# Patient Record
Sex: Male | Born: 1960 | Race: Black or African American | Hispanic: No | Marital: Single | Smoking: Current some day smoker
Health system: Southern US, Community
[De-identification: ages and names within clinical notes are randomized; demographics above are authoritative.]

## PROBLEM LIST (undated history)

## (undated) DIAGNOSIS — N400 Enlarged prostate without lower urinary tract symptoms: Secondary | ICD-10-CM

## (undated) DIAGNOSIS — I10 Essential (primary) hypertension: Secondary | ICD-10-CM

## (undated) HISTORY — PX: OTHER SURGICAL HISTORY: SHX169

---

## 2001-05-21 ENCOUNTER — Emergency Department (HOSPITAL_COMMUNITY): Admission: EM | Admit: 2001-05-21 | Discharge: 2001-05-21 | Payer: Self-pay | Admitting: Emergency Medicine

## 2001-05-25 ENCOUNTER — Emergency Department (HOSPITAL_COMMUNITY): Admission: EM | Admit: 2001-05-25 | Discharge: 2001-05-25 | Payer: Self-pay | Admitting: Emergency Medicine

## 2013-08-08 ENCOUNTER — Encounter (HOSPITAL_COMMUNITY): Payer: Self-pay | Admitting: *Deleted

## 2013-08-08 ENCOUNTER — Emergency Department (HOSPITAL_COMMUNITY)
Admission: EM | Admit: 2013-08-08 | Discharge: 2013-08-08 | Disposition: A | Discharge status: Home / Self Care | Payer: Self-pay | Attending: Emergency Medicine | Admitting: Emergency Medicine

## 2013-08-08 DIAGNOSIS — K047 Periapical abscess without sinus: Secondary | ICD-10-CM

## 2013-08-08 DIAGNOSIS — K0889 Other specified disorders of teeth and supporting structures: Secondary | ICD-10-CM

## 2013-08-08 DIAGNOSIS — F17200 Nicotine dependence, unspecified, uncomplicated: Secondary | ICD-10-CM | POA: Insufficient documentation

## 2013-08-08 DIAGNOSIS — K044 Acute apical periodontitis of pulpal origin: Secondary | ICD-10-CM | POA: Insufficient documentation

## 2013-08-08 MED ORDER — AMOXICILLIN 500 MG PO CAPS: 500.0000 mg | ORAL_CAPSULE | Freq: Three times a day (TID) | ORAL | Status: DC

## 2013-08-08 MED ORDER — HYDROCODONE-ACETAMINOPHEN 5-325 MG PO TABS: 1.0000 | ORAL_TABLET | Freq: Four times a day (QID) | ORAL | Status: DC | PRN

## 2013-08-08 MED ORDER — OXYCODONE-ACETAMINOPHEN 5-325 MG PO TABS
2.0000 | ORAL_TABLET | Freq: Once | ORAL | Status: AC
  Administered 2013-08-08: 2 via ORAL
  Filled 2013-08-08: qty 2

## 2013-08-08 NOTE — ED Provider Notes (Signed)
CSN: [id number]     Arrival date & time 08/08/13  4098 History   First MD Initiated Contact with Patient 08/08/13 0535     Chief Complaint  Patient presents with  . Dental Pain   (Consider location/radiation/quality/duration/timing/severity/associated sxs/prior Treatment) HPI Comments: 52 year old male presents to the emergency department complaining of left lower tooth pain x2 weeks. Describes pain as sharp and throbbing, worse with chewing rated 10 out of 10. Pain radiates to the left side of his head. States the left-sided space has been swelling on and off. He has tried taking Tylenol without relief. He does not have a dentist. Denies fever, chills, difficulty breathing or swallowing.  Patient is a 52 y.o. male presenting with tooth pain. The history is provided by the patient and the spouse.  Dental Pain Associated symptoms: no fever     History reviewed. No pertinent past medical history. History reviewed. No pertinent past surgical history. No family history on file. History  Substance Use Topics  . Smoking status: Current Every Day Smoker  . Smokeless tobacco: Not on file  . Alcohol Use: Yes    Review of Systems  Constitutional: Negative for fever and chills.  HENT: Positive for dental problem. Negative for trouble swallowing.   All other systems reviewed and are negative.    Allergies  Review of patient's allergies indicates no known allergies.  Home Medications   Current Outpatient Rx  Name  Route  Sig  Dispense  Refill  . acetaminophen (TYLENOL) 500 MG tablet   Oral   Take 1,000 mg by mouth every 4 (four) hours as needed for pain.         Marland Kitchen amoxicillin (AMOXIL) 500 MG capsule   Oral   Take 1 capsule (500 mg total) by mouth 3 (three) times daily.   21 capsule   0   . HYDROcodone-acetaminophen (NORCO/VICODIN) 5-325 MG per tablet   Oral   Take 1-2 tablets by mouth every 6 (six) hours as needed for pain.   6 tablet   0    BP 140/88  Temp(Src) 99.3 F  (37.4 C) (Oral)  Resp 18  Ht 5\' 11"  (1.803 m)  Wt 200 lb (90.719 kg)  BMI 27.91 kg/m2  SpO2 99% Physical Exam  Nursing note and vitals reviewed. Constitutional: He is oriented to person, place, and time. He appears well-developed and well-nourished. No distress.  HENT:  Head: Normocephalic and atraumatic.  Mouth/Throat: Uvula is midline, oropharynx is clear and moist and mucous membranes are normal. No trismus in the jaw.  Poor dentition throughout. Left lower third molar tender, surrounding erythema and edema without abscess. No facial swelling.  Eyes: Conjunctivae are normal.  Neck: Normal range of motion. Neck supple.  Cardiovascular: Normal rate, regular rhythm and normal heart sounds.   Pulmonary/Chest: Effort normal and breath sounds normal.  Musculoskeletal: Normal range of motion. He exhibits no edema.  Lymphadenopathy:       Head (left side): Submandibular adenopathy present.  Neurological: He is alert and oriented to person, place, and time.  Skin: Skin is warm and dry. He is not diaphoretic.  Psychiatric: He has a normal mood and affect. His behavior is normal.    ED Course  Procedures (including critical care time) Labs Review Labs Reviewed - No data to display Imaging Review No results found.  MDM   1. Dental infection   2. Pain, dental     Dental pain associated with dental infection. No evidence of dental abscess. Patient is  afebrile, non toxic appearing and swallowing secretions well. I gave patient referral to dentist and stressed the importance of dental follow up for ultimate management of dental pain. I will also give amoxicillin and pain control. Patient voices understanding and is agreeable to plan.     Trevor Mace, PA-C 08/08/13 [phone]

## 2013-08-08 NOTE — ED Notes (Signed)
Patient states left sided tooth pain x 2 weeks now radiating and causing headache

## 2013-08-09 NOTE — ED Provider Notes (Signed)
Medical screening examination/treatment/procedure(s) were performed by non-physician practitioner and as supervising physician I was immediately available for consultation/collaboration.   Gwyneth Sprout, MD 08/09/13 [phone]

## 2019-12-09 ENCOUNTER — Emergency Department (HOSPITAL_COMMUNITY): Payer: No Typology Code available for payment source

## 2019-12-09 ENCOUNTER — Encounter (HOSPITAL_COMMUNITY): Payer: Self-pay | Admitting: Emergency Medicine

## 2019-12-09 ENCOUNTER — Emergency Department (HOSPITAL_COMMUNITY)
Admission: EM | Admit: 2019-12-09 | Discharge: 2019-12-09 | Disposition: A | Discharge status: Home / Self Care | Payer: No Typology Code available for payment source | Attending: Emergency Medicine | Admitting: Emergency Medicine

## 2019-12-09 DIAGNOSIS — Z20822 Contact with and (suspected) exposure to covid-19: Secondary | ICD-10-CM | POA: Insufficient documentation

## 2019-12-09 DIAGNOSIS — I10 Essential (primary) hypertension: Secondary | ICD-10-CM | POA: Insufficient documentation

## 2019-12-09 DIAGNOSIS — F17200 Nicotine dependence, unspecified, uncomplicated: Secondary | ICD-10-CM | POA: Diagnosis not present

## 2019-12-09 DIAGNOSIS — Y92410 Unspecified street and highway as the place of occurrence of the external cause: Secondary | ICD-10-CM | POA: Diagnosis not present

## 2019-12-09 DIAGNOSIS — R0789 Other chest pain: Secondary | ICD-10-CM | POA: Diagnosis not present

## 2019-12-09 DIAGNOSIS — Z23 Encounter for immunization: Secondary | ICD-10-CM | POA: Diagnosis not present

## 2019-12-09 DIAGNOSIS — Y999 Unspecified external cause status: Secondary | ICD-10-CM | POA: Insufficient documentation

## 2019-12-09 DIAGNOSIS — Y939 Activity, unspecified: Secondary | ICD-10-CM | POA: Insufficient documentation

## 2019-12-09 DIAGNOSIS — S52331A Displaced oblique fracture of shaft of right radius, initial encounter for closed fracture: Secondary | ICD-10-CM

## 2019-12-09 DIAGNOSIS — S59911A Unspecified injury of right forearm, initial encounter: Secondary | ICD-10-CM | POA: Diagnosis present

## 2019-12-09 HISTORY — DX: Essential (primary) hypertension: I10

## 2019-12-09 LAB — RESPIRATORY PANEL BY RT PCR (FLU A&B, COVID)
Influenza A by PCR: NEGATIVE
Influenza B by PCR: NEGATIVE
SARS Coronavirus 2 by RT PCR: NEGATIVE

## 2019-12-09 MED ORDER — TETANUS-DIPHTH-ACELL PERTUSSIS 5-2.5-18.5 LF-MCG/0.5 IM SUSP
0.5000 mL | Freq: Once | INTRAMUSCULAR | Status: AC
  Administered 2019-12-09: 18:00:00 0.5 mL via INTRAMUSCULAR
  Filled 2019-12-09: qty 0.5

## 2019-12-09 MED ORDER — HYDROMORPHONE HCL 1 MG/ML IJ SOLN
1.0000 mg | Freq: Once | INTRAMUSCULAR | Status: AC
  Administered 2019-12-09: 19:00:00 1 mg via INTRAVENOUS
  Filled 2019-12-09: qty 1

## 2019-12-09 MED ORDER — CYCLOBENZAPRINE HCL 10 MG PO TABS
10.0000 mg | ORAL_TABLET | Freq: Once | ORAL | Status: AC
  Administered 2019-12-09: 19:00:00 10 mg via ORAL
  Filled 2019-12-09: qty 1

## 2019-12-09 MED ORDER — HYDROCODONE-ACETAMINOPHEN 5-325 MG PO TABS
1.0000 | ORAL_TABLET | Freq: Four times a day (QID) | ORAL | 0 refills | Status: DC | PRN
Start: 2019-12-09 — End: 2019-12-20
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
## 2019-12-09 NOTE — ED Notes (Signed)
Pt to xray
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
## 2019-12-09 NOTE — ED Notes (Signed)
Ortho tech paged, requested by PA.
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
## 2019-12-09 NOTE — Progress Notes (Signed)
Orthopedic Tech Progress Note Patient Details:  Trentan Trippe 11-24-1960 [id number]  Ortho Devices Type of Ortho Device: Ace wrap, Sugartong splint Ortho Device/Splint Location: RUE Ortho Device/Splint Interventions: Ordered, Application   Post Interventions Patient Tolerated: Well Instructions Provided: Care of device   Ancil Linsey 12/09/2019, 8:13 PM
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
## 2019-12-10 ENCOUNTER — Other Ambulatory Visit: Payer: Self-pay | Admitting: Orthopedic Surgery

## 2019-12-13 ENCOUNTER — Encounter (HOSPITAL_COMMUNITY)
Admission: RE | Admit: 2019-12-13 | Discharge: 2019-12-13 | Disposition: A | Discharge status: Home / Self Care | Payer: No Typology Code available for payment source | Source: Ambulatory Visit | Attending: Orthopedic Surgery | Admitting: Orthopedic Surgery

## 2019-12-13 ENCOUNTER — Inpatient Hospital Stay (HOSPITAL_COMMUNITY): Admission: RE | Admit: 2019-12-13 | Payer: Self-pay | Source: Ambulatory Visit

## 2019-12-13 ENCOUNTER — Other Ambulatory Visit: Payer: Self-pay

## 2019-12-13 ENCOUNTER — Encounter (HOSPITAL_BASED_OUTPATIENT_CLINIC_OR_DEPARTMENT_OTHER)
Admission: RE | Admit: 2019-12-13 | Discharge: 2019-12-13 | Disposition: A | Discharge status: Home / Self Care | Payer: No Typology Code available for payment source | Source: Ambulatory Visit | Attending: Orthopedic Surgery | Admitting: Orthopedic Surgery

## 2019-12-13 ENCOUNTER — Encounter (HOSPITAL_BASED_OUTPATIENT_CLINIC_OR_DEPARTMENT_OTHER): Payer: Self-pay | Admitting: Orthopedic Surgery

## 2019-12-13 DIAGNOSIS — Z01812 Encounter for preprocedural laboratory examination: Secondary | ICD-10-CM | POA: Diagnosis present

## 2019-12-13 LAB — SURGICAL PCR SCREEN
MRSA, PCR: NEGATIVE
Staphylococcus aureus: NEGATIVE

## 2019-12-13 MED ORDER — ENSURE PRE-SURGERY PO LIQD: 296.0000 mL | Freq: Once | ORAL | Status: DC

## 2019-12-13 NOTE — Progress Notes (Signed)

## 2019-12-14 MED ORDER — BUPIVACAINE HCL (PF) 0.25 % IJ SOLN
INTRAMUSCULAR | Status: AC
  Filled 2019-12-14: qty 30

## 2019-12-14 MED ORDER — BACITRACIN-NEOMYCIN-POLYMYXIN OINTMENT TUBE
TOPICAL_OINTMENT | CUTANEOUS | Status: AC
  Filled 2019-12-14: qty 14.17

## 2019-12-17 ENCOUNTER — Encounter (HOSPITAL_BASED_OUTPATIENT_CLINIC_OR_DEPARTMENT_OTHER)
Admission: RE | Admit: 2019-12-17 | Discharge: 2019-12-17 | Disposition: A | Discharge status: Home / Self Care | Payer: No Typology Code available for payment source | Source: Ambulatory Visit | Attending: Orthopedic Surgery | Admitting: Orthopedic Surgery

## 2019-12-17 ENCOUNTER — Other Ambulatory Visit: Payer: Self-pay

## 2019-12-17 ENCOUNTER — Other Ambulatory Visit (HOSPITAL_COMMUNITY)
Admission: RE | Admit: 2019-12-17 | Discharge: 2019-12-17 | Disposition: A | Discharge status: Home / Self Care | Payer: No Typology Code available for payment source | Source: Ambulatory Visit | Attending: Orthopedic Surgery | Admitting: Orthopedic Surgery

## 2019-12-17 DIAGNOSIS — Z20822 Contact with and (suspected) exposure to covid-19: Secondary | ICD-10-CM | POA: Diagnosis not present

## 2019-12-17 DIAGNOSIS — Z01812 Encounter for preprocedural laboratory examination: Secondary | ICD-10-CM | POA: Diagnosis present

## 2019-12-17 LAB — BASIC METABOLIC PANEL
Anion gap: 13 (ref 5–15)
BUN: 14 mg/dL (ref 6–20)
CO2: 26 mmol/L (ref 22–32)
Calcium: 9.5 mg/dL (ref 8.9–10.3)
Chloride: 99 mmol/L (ref 98–111)
Creatinine, Ser: 0.74 mg/dL (ref 0.61–1.24)
GFR calc Af Amer: >60 mL/min (ref >60)
GFR calc non Af Amer: >60 mL/min (ref >60)
Glucose, Bld: 100 mg/dL — ABNORMAL HIGH (ref 70–99)
Potassium: 4.6 mmol/L (ref 3.5–5.1)
Sodium: 138 mmol/L (ref 135–145)

## 2019-12-17 LAB — SARS CORONAVIRUS 2 (TAT 6-24 HRS): SARS Coronavirus 2: NEGATIVE

## 2019-12-20 ENCOUNTER — Encounter (HOSPITAL_BASED_OUTPATIENT_CLINIC_OR_DEPARTMENT_OTHER)
Admission: RE | Disposition: A | Discharge status: Home / Self Care | Payer: Self-pay | Source: Home / Self Care | Attending: Orthopedic Surgery

## 2019-12-20 ENCOUNTER — Ambulatory Visit (HOSPITAL_BASED_OUTPATIENT_CLINIC_OR_DEPARTMENT_OTHER): Payer: No Typology Code available for payment source | Admitting: Anesthesiology

## 2019-12-20 ENCOUNTER — Other Ambulatory Visit: Payer: Self-pay

## 2019-12-20 ENCOUNTER — Ambulatory Visit (HOSPITAL_COMMUNITY)
Admission: RE | Admit: 2019-12-20 | Discharge: 2019-12-20 | Disposition: A | Discharge status: Home / Self Care | Payer: No Typology Code available for payment source | Attending: Orthopedic Surgery | Admitting: Orthopedic Surgery

## 2019-12-20 ENCOUNTER — Ambulatory Visit (HOSPITAL_COMMUNITY): Payer: No Typology Code available for payment source

## 2019-12-20 ENCOUNTER — Encounter (HOSPITAL_BASED_OUTPATIENT_CLINIC_OR_DEPARTMENT_OTHER): Payer: Self-pay | Admitting: Orthopedic Surgery

## 2019-12-20 DIAGNOSIS — S52371A Galeazzi's fracture of right radius, initial encounter for closed fracture: Secondary | ICD-10-CM | POA: Insufficient documentation

## 2019-12-20 DIAGNOSIS — I10 Essential (primary) hypertension: Secondary | ICD-10-CM | POA: Diagnosis not present

## 2019-12-20 DIAGNOSIS — F17200 Nicotine dependence, unspecified, uncomplicated: Secondary | ICD-10-CM | POA: Diagnosis not present

## 2019-12-20 DIAGNOSIS — Z4789 Encounter for other orthopedic aftercare: Secondary | ICD-10-CM

## 2019-12-20 HISTORY — PX: OPEN REDUCTION INTERNAL FIXATION (ORIF) DISTAL RADIAL FRACTURE: SHX5989

## 2019-12-20 SURGERY — OPEN REDUCTION INTERNAL FIXATION (ORIF) DISTAL RADIUS FRACTURE
Anesthesia: General | Site: Arm Lower | Laterality: Right

## 2019-12-20 MED ORDER — CEFAZOLIN SODIUM-DEXTROSE 2-4 GM/100ML-% IV SOLN
INTRAVENOUS | Status: AC
  Filled 2019-12-20: qty 100

## 2019-12-20 MED ORDER — LIDOCAINE 2% (20 MG/ML) 5 ML SYRINGE
INTRAMUSCULAR | Status: DC | PRN
  Administered 2019-12-20: 20 mg via INTRAVENOUS

## 2019-12-20 MED ORDER — CHLORHEXIDINE GLUCONATE 4 % EX LIQD: 60.0000 mL | Freq: Once | CUTANEOUS | Status: DC

## 2019-12-20 MED ORDER — CEFAZOLIN SODIUM-DEXTROSE 2-4 GM/100ML-% IV SOLN
2.0000 g | INTRAVENOUS | Status: AC
  Administered 2019-12-20: 2 g via INTRAVENOUS

## 2019-12-20 MED ORDER — FENTANYL CITRATE (PF) 100 MCG/2ML IJ SOLN
50.0000 ug | INTRAMUSCULAR | Status: DC | PRN
  Administered 2019-12-20: 50 ug via INTRAVENOUS

## 2019-12-20 MED ORDER — MIDAZOLAM HCL 2 MG/2ML IJ SOLN
1.0000 mg | INTRAMUSCULAR | Status: DC | PRN
  Administered 2019-12-20: 2 mg via INTRAVENOUS

## 2019-12-20 MED ORDER — DEXAMETHASONE SODIUM PHOSPHATE 10 MG/ML IJ SOLN
INTRAMUSCULAR | Status: DC | PRN
  Administered 2019-12-20: 4 mg via INTRAVENOUS

## 2019-12-20 MED ORDER — FENTANYL CITRATE (PF) 100 MCG/2ML IJ SOLN
INTRAMUSCULAR | Status: AC
  Filled 2019-12-20: qty 2

## 2019-12-20 MED ORDER — BUPIVACAINE HCL (PF) 0.5 % IJ SOLN
INTRAMUSCULAR | Status: DC | PRN
  Administered 2019-12-20: 15 mL via PERINEURAL

## 2019-12-20 MED ORDER — LIDOCAINE-EPINEPHRINE 2 %-1:100000 IJ SOLN
INTRAMUSCULAR | Status: AC
  Filled 2019-12-20: qty 1

## 2019-12-20 MED ORDER — MIDAZOLAM HCL 2 MG/2ML IJ SOLN
INTRAMUSCULAR | Status: AC
  Filled 2019-12-20: qty 2

## 2019-12-20 MED ORDER — BUPIVACAINE HCL (PF) 0.5 % IJ SOLN
INTRAMUSCULAR | Status: AC
  Filled 2019-12-20: qty 90

## 2019-12-20 MED ORDER — OXYCODONE HCL 5 MG PO TABS: 5.0000 mg | ORAL_TABLET | Freq: Four times a day (QID) | ORAL | 0 refills | Status: DC | PRN

## 2019-12-20 MED ORDER — LIDOCAINE 2% (20 MG/ML) 5 ML SYRINGE
INTRAMUSCULAR | Status: AC
  Filled 2019-12-20: qty 5

## 2019-12-20 MED ORDER — LACTATED RINGERS IV SOLN: INTRAVENOUS | Status: DC

## 2019-12-20 MED ORDER — EPINEPHRINE PF 1 MG/ML IJ SOLN
INTRAMUSCULAR | Status: AC
  Filled 2019-12-20: qty 1

## 2019-12-20 MED ORDER — ACETAMINOPHEN 325 MG PO TABS: 650.0000 mg | ORAL_TABLET | Freq: Four times a day (QID) | ORAL | Status: DC

## 2019-12-20 MED ORDER — KETOROLAC TROMETHAMINE 30 MG/ML IJ SOLN: 30.0000 mg | Freq: Once | INTRAMUSCULAR | Status: DC | PRN

## 2019-12-20 MED ORDER — DEXAMETHASONE SODIUM PHOSPHATE 10 MG/ML IJ SOLN
INTRAMUSCULAR | Status: AC
  Filled 2019-12-20: qty 1

## 2019-12-20 MED ORDER — PROPOFOL 10 MG/ML IV BOLUS
INTRAVENOUS | Status: AC
  Filled 2019-12-20: qty 20

## 2019-12-20 MED ORDER — LIDOCAINE-EPINEPHRINE (PF) 1.5 %-1:200000 IJ SOLN
INTRAMUSCULAR | Status: DC | PRN
  Administered 2019-12-20: 15 mL via PERINEURAL

## 2019-12-20 MED ORDER — POVIDONE-IODINE 10 % EX SWAB: 2.0000 "application " | Freq: Once | CUTANEOUS | Status: DC

## 2019-12-20 MED ORDER — PHENYLEPHRINE 40 MCG/ML (10ML) SYRINGE FOR IV PUSH (FOR BLOOD PRESSURE SUPPORT)
PREFILLED_SYRINGE | INTRAVENOUS | Status: DC | PRN
  Administered 2019-12-20 (×5): 80 ug via INTRAVENOUS

## 2019-12-20 MED ORDER — PROPOFOL 10 MG/ML IV BOLUS
INTRAVENOUS | Status: DC | PRN
  Administered 2019-12-20: 200 mg via INTRAVENOUS

## 2019-12-20 MED ORDER — IBUPROFEN 200 MG PO TABS: 600.0000 mg | ORAL_TABLET | Freq: Four times a day (QID) | ORAL | Status: DC

## 2019-12-20 MED ORDER — ONDANSETRON HCL 4 MG/2ML IJ SOLN
INTRAMUSCULAR | Status: DC | PRN
  Administered 2019-12-20: 4 mg via INTRAVENOUS

## 2019-12-20 MED ORDER — PROMETHAZINE HCL 25 MG/ML IJ SOLN: 6.2500 mg | INTRAMUSCULAR | Status: DC | PRN

## 2019-12-20 MED ORDER — FENTANYL CITRATE (PF) 100 MCG/2ML IJ SOLN: 25.0000 ug | INTRAMUSCULAR | Status: DC | PRN

## 2019-12-20 MED ORDER — EPHEDRINE SULFATE-NACL 50-0.9 MG/10ML-% IV SOSY
PREFILLED_SYRINGE | INTRAVENOUS | Status: DC | PRN
  Administered 2019-12-20: 10 mg via INTRAVENOUS

## 2019-12-20 SURGICAL SUPPLY — 70 items
BAND RUBBER #18 3X1/16 STRL (MISCELLANEOUS) IMPLANT
BIT DRILL 2.5X2.75 QC CALB (BIT) ×3 IMPLANT
BIT DRILL 3.5X5.5 QC CALB (BIT) ×3 IMPLANT
BIT DRILL CALIBRATED 2.7 (BIT) ×2 IMPLANT
BIT DRILL CALIBRATED 2.7MM (BIT) ×1
BLADE MINI RND TIP GREEN BEAV (BLADE) ×3 IMPLANT
BLADE SURG 15 STRL LF DISP TIS (BLADE) ×1 IMPLANT
BLADE SURG 15 STRL SS (BLADE) ×2
BNDG COHESIVE 2X5 TAN STRL LF (GAUZE/BANDAGES/DRESSINGS) IMPLANT
BNDG COHESIVE 4X5 TAN STRL (GAUZE/BANDAGES/DRESSINGS) ×3 IMPLANT
BNDG ESMARK 4X9 LF (GAUZE/BANDAGES/DRESSINGS) ×3 IMPLANT
BNDG GAUZE ELAST 4 BULKY (GAUZE/BANDAGES/DRESSINGS) ×3 IMPLANT
BRUSH SCRUB EZ PLAIN DRY (MISCELLANEOUS) IMPLANT
CANISTER SUCT 1200ML W/VALVE (MISCELLANEOUS) ×3 IMPLANT
CHLORAPREP W/TINT 26 (MISCELLANEOUS) ×3 IMPLANT
CORD BIPOLAR FORCEPS 12FT (ELECTRODE) ×3 IMPLANT
COVER BACK TABLE 60X90IN (DRAPES) ×3 IMPLANT
COVER MAYO STAND STRL (DRAPES) ×3 IMPLANT
COVER WAND RF STERILE (DRAPES) IMPLANT
CUFF TOURN SGL QUICK 18X4 (TOURNIQUET CUFF) ×3 IMPLANT
CUFF TOURN SGL QUICK 24 (TOURNIQUET CUFF)
CUFF TRNQT CYL 24X4X16.5-23 (TOURNIQUET CUFF) IMPLANT
DRAPE C-ARM 42X72 X-RAY (DRAPES) ×3 IMPLANT
DRAPE EXTREMITY T 121X128X90 (DISPOSABLE) ×3 IMPLANT
DRAPE SURG 17X23 STRL (DRAPES) ×3 IMPLANT
DRSG ADAPTIC 3X8 NADH LF (GAUZE/BANDAGES/DRESSINGS) ×3 IMPLANT
DRSG EMULSION OIL 3X3 NADH (GAUZE/BANDAGES/DRESSINGS) IMPLANT
ELECT REM PT RETURN 9FT ADLT (ELECTROSURGICAL) ×3
ELECTRODE REM PT RTRN 9FT ADLT (ELECTROSURGICAL) ×1 IMPLANT
GAUZE SPONGE 4X4 12PLY STRL LF (GAUZE/BANDAGES/DRESSINGS) ×3 IMPLANT
GLOVE BIO SURGEON STRL SZ7.5 (GLOVE) ×3 IMPLANT
GLOVE BIOGEL PI IND STRL 7.0 (GLOVE) ×2 IMPLANT
GLOVE BIOGEL PI IND STRL 8 (GLOVE) ×1 IMPLANT
GLOVE BIOGEL PI INDICATOR 7.0 (GLOVE) ×4
GLOVE BIOGEL PI INDICATOR 8 (GLOVE) ×2
GLOVE ECLIPSE 6.5 STRL STRAW (GLOVE) ×6 IMPLANT
GOWN STRL REUS W/ TWL LRG LVL3 (GOWN DISPOSABLE) ×2 IMPLANT
GOWN STRL REUS W/TWL LRG LVL3 (GOWN DISPOSABLE) ×4
GOWN STRL REUS W/TWL XL LVL3 (GOWN DISPOSABLE) ×6 IMPLANT
NEEDLE HYPO 25X1 1.5 SAFETY (NEEDLE) IMPLANT
NS IRRIG 1000ML POUR BTL (IV SOLUTION) ×3 IMPLANT
PACK BASIN DAY SURGERY FS (CUSTOM PROCEDURE TRAY) ×3 IMPLANT
PADDING CAST ABS 4INX4YD NS (CAST SUPPLIES) ×2
PADDING CAST ABS COTTON 4X4 ST (CAST SUPPLIES) ×1 IMPLANT
PENCIL SMOKE EVACUATOR (MISCELLANEOUS) ×3 IMPLANT
PLATE LOCK COMP 8H 3.5 FOOT (Plate) ×3 IMPLANT
SCREW CORTICAL 3.5MM  16MM (Screw) ×2 IMPLANT
SCREW CORTICAL 3.5MM 16MM (Screw) ×1 IMPLANT
SCREW CORTICAL 3.5MM 18MM (Screw) ×12 IMPLANT
SCREW LOCK CORT STAR 3.5X12 (Screw) ×9 IMPLANT
SCREW LOCK CORT STAR 3.5X14 (Screw) ×3 IMPLANT
SLEEVE SCD COMPRESS KNEE MED (MISCELLANEOUS) ×3 IMPLANT
SLING ARM FOAM STRAP LRG (SOFTGOODS) ×6 IMPLANT
SPLINT FIBERGLASS 3X35 (CAST SUPPLIES) ×3 IMPLANT
SPLINT PLASTER CAST XFAST 3X15 (CAST SUPPLIES) IMPLANT
SPLINT PLASTER XTRA FASTSET 3X (CAST SUPPLIES)
STOCKINETTE 6  STRL (DRAPES) ×2
STOCKINETTE 6 STRL (DRAPES) ×1 IMPLANT
SUCTION FRAZIER HANDLE 10FR (MISCELLANEOUS) ×2
SUCTION TUBE FRAZIER 10FR DISP (MISCELLANEOUS) ×1 IMPLANT
SUT VIC AB 2-0 PS2 27 (SUTURE) ×3 IMPLANT
SUT VICRYL 4-0 PS2 18IN ABS (SUTURE) IMPLANT
SUT VICRYL RAPIDE 4-0 (SUTURE) IMPLANT
SUT VICRYL RAPIDE 4/0 PS 2 (SUTURE) ×3 IMPLANT
SYR 10ML LL (SYRINGE) IMPLANT
SYR BULB 3OZ (MISCELLANEOUS) ×3 IMPLANT
TOWEL GREEN STERILE FF (TOWEL DISPOSABLE) ×3 IMPLANT
TUBE CONNECTING 20'X1/4 (TUBING) ×1
TUBE CONNECTING 20X1/4 (TUBING) ×2 IMPLANT
UNDERPAD 30X36 HEAVY ABSORB (UNDERPADS AND DIAPERS) ×3 IMPLANT

## 2019-12-20 NOTE — Anesthesia Postprocedure Evaluation (Signed)
Anesthesia Post Note  Patient: Chirstopher Iovino  Procedure(s) Performed: OPEN REDUCTION INTERNAL FIXATION (ORIF) RIGHT RADIUS SHAFT (Right Arm Lower)     Patient location during evaluation: PACU Anesthesia Type: General Level of consciousness: awake and alert Pain management: pain level controlled Vital Signs Assessment: post-procedure vital signs reviewed and stable Respiratory status: spontaneous breathing, nonlabored ventilation, respiratory function stable and patient connected to nasal cannula oxygen Cardiovascular status: blood pressure returned to baseline and stable Postop Assessment: no apparent nausea or vomiting Anesthetic complications: no    Last Vitals:  Vitals:   12/20/19 0903 12/20/19 0915  BP: (!) 139/95 (!) 138/92  Pulse: 76 73  Resp: 15 12  Temp: (!) 36.4 C   SpO2: 97% 99%    Last Pain:  Vitals:   12/20/19 0915  TempSrc:   PainSc: 0-No pain                 Dez Stauffer S

## 2019-12-20 NOTE — Anesthesia Preprocedure Evaluation (Signed)
Anesthesia Evaluation  Patient identified by MRN, date of birth, ID band Patient awake    Reviewed: Allergy & Precautions, NPO status , Patient's Chart, lab work & pertinent test results  Airway Mallampati: II  TM Distance: >3 FB Neck ROM: Full    Dental no notable dental hx.    Pulmonary Current Smoker and Patient abstained from smoking.,    Pulmonary exam normal breath sounds clear to auscultation       Cardiovascular hypertension, Pt. on medications Normal cardiovascular exam Rhythm:Regular Rate:Normal     Neuro/Psych negative neurological ROS  negative psych ROS   GI/Hepatic negative GI ROS, Neg liver ROS,   Endo/Other  negative endocrine ROS  Renal/GU negative Renal ROS  negative genitourinary   Musculoskeletal negative musculoskeletal ROS (+)   Abdominal   Peds negative pediatric ROS (+)  Hematology negative hematology ROS (+)   Anesthesia Other Findings   Reproductive/Obstetrics negative OB ROS                             Anesthesia Physical Anesthesia Plan  ASA: II  Anesthesia Plan: General   Post-op Pain Management:  Regional for Post-op pain   Induction: Intravenous  PONV Risk Score and Plan: 1 and Ondansetron, Dexamethasone and Treatment may vary due to age or medical condition  Airway Management Planned: LMA  Additional Equipment:   Intra-op Plan:   Post-operative Plan: Extubation in OR  Informed Consent: I have reviewed the patients History and Physical, chart, labs and discussed the procedure including the risks, benefits and alternatives for the proposed anesthesia with the patient or authorized representative who has indicated his/her understanding and acceptance.     Dental advisory given  Plan Discussed with: CRNA and Surgeon  Anesthesia Plan Comments:         Anesthesia Quick Evaluation

## 2019-12-20 NOTE — Interval H&P Note (Signed)
History and Physical Interval Note:  12/20/2019 7:32 AM  Kyle Zamora  has presented today for surgery, with the diagnosis of RIGHT  RADIUS SHAFT FRACTURE.  The various methods of treatment have been discussed with the patient and family. After consideration of risks, benefits and other options for treatment, the patient has consented to  Procedure(s) with comments: OPEN REDUCTION INTERNAL FIXATION (ORIF) RIGHT RADIUS SHAFT (Right) - PRE-OP BLOCK as a surgical intervention.  The patient's history has been reviewed, patient examined, no change in status, stable for surgery.  I have reviewed the patient's chart and labs.  Questions were answered to the patient's satisfaction.     Jodi Marble

## 2019-12-20 NOTE — Transfer of Care (Signed)
Immediate Anesthesia Transfer of Care Note  Patient: Kyle Zamora  Procedure(s) Performed: OPEN REDUCTION INTERNAL FIXATION (ORIF) RIGHT RADIUS SHAFT (Right Arm Lower)  Patient Location: PACU  Anesthesia Type:General  Level of Consciousness: awake, alert , oriented and patient cooperative  Airway & Oxygen Therapy: Patient Spontanous Breathing and Patient connected to nasal cannula oxygen  Post-op Assessment: Report given to RN, Post -op Vital signs reviewed and stable and Patient moving all extremities  Post vital signs: Reviewed and stable  Last Vitals:  Vitals Value Taken Time  BP 139/95 12/20/19 0903  Temp    Pulse 80 12/20/19 0907  Resp 13 12/20/19 0907  SpO2 100 % 12/20/19 0907  Vitals shown include unvalidated device data.  Last Pain:  Vitals:   12/20/19 0641  TempSrc: Tympanic  PainSc: 0-No pain      Patients Stated Pain Goal: 8 (12/20/19 2244)  Complications: No apparent anesthesia complications

## 2019-12-20 NOTE — Progress Notes (Signed)
Assisted Dr. Rose with right, ultrasound guided, supraclavicular block. Side rails up, monitors on throughout procedure. See vital signs in flow sheet. Tolerated Procedure well. 

## 2019-12-20 NOTE — Op Note (Signed)
12/20/2019  7:33 AM  PATIENT:  Kyle Zamora  59 y.o. male  PRE-OPERATIVE DIAGNOSIS:  Right radius shaft fracture (with possible Galeazzi injury)  POST-OPERATIVE DIAGNOSIS:  Same  PROCEDURE:  ORIF R radius shaft fracture, with closed tx of DRUJ  SURGEON: Rayvon Char. [place] Silos, MD  PHYSICIAN ASSISTANT: Morley Kos, OPA-C  ANESTHESIA:  regional and MAC  SPECIMENS:  None  DRAINS: None  EBL:  less than 50 mL  PREOPERATIVE INDICATIONS:  [name] is a  59 y.o. male with a closed fx of the right radial shaft at the junction of the middle and distal 1/3, likely with some degree of DRUJ injury  The risks benefits and alternatives were discussed with the patient preoperatively including but not limited to the risks of infection, bleeding, nerve injury, cardiopulmonary complications, the need for revision surgery, among others, and the patient verbalized understanding and consented to proceed.  OPERATIVE IMPLANTS: Biomet small fragment 7-hole plate  OPERATIVE PROCEDURE: After receiving prophylactic antibiotics and a regional block, the patient was escorted to the operative theatre and placed in a supine position.  General anesthesia was administered.  A surgical "time-out" was performed during which the planned procedure, proposed operative site, and the correct patient identity were compared to the operative consent and agreement confirmed by the circulating nurse according to current facility policy. Following application of a tourniquet to the operative extremity, the exposed skin had been prescrubbed in the holding area, so it was subsequently prepped with Chloraprep and draped in the usual sterile fashion. The limb was exsanguinated with an Esmarch bandage and the tourniquet inflated to approximately 132mHg higher than systolic BP.   A linear longitudinal HMallie Musselapproach was marked and made, centered over the fracture site.  Deeper dissection was carried through in the interval between  the radial artery and the brachioradialis.  In the distal aspect, the FPL and pronator were lifted off of the distal radius with Bovie electrocautery.  The volar surface of the shaft the radius was thus exposed, the fracture site cleansed of debris and provisional reduction obtained.  There was a small cortical fragment from the deep side that could not be retained and this was excised.  The reduction was judged to be anatomic.  An 8 hole plate was then slightly contoured as needed for the anterior surface of the radius, and the first screw placed was an interfragmentary compressing screw through the plate.  After this, the distal holes were filled with a combination of locking and nonlocking screws and then actually a compressing screw was placed proximally followed by the remainder of the screws proximally, again a mixture of locking and nonlocking screws.  At this point, the DRUJ was assessed for stability and radiographic congruity, thought to be acceptable, especially since the postoperative immobilization plan was semisupination with a sugar tong splint for at least the first couple weeks.  Final fluoroscopic images were obtained revealing essentially anatomic alignment.  The noted dorsal cortical defect was there as well.  The wounds were copiously irrigated and the tourniquet released.  Some mild additional hemostasis was obtained with bipolar electrocautery.  The skin was then reapproximated with 2-0 Vicryl deep dermal buried subcuticular sutures.  The skin itself for the distal few centimeters was reapproximated with running 4-0 Vicryl Rapide horizontal mattress suture and staples were placed more proximally.  A sugar tong splint was applied with the forearm semisupinated and he was taken to the recovery room in stable condition, breathing spontaneously.    DISPOSITION: The  patient will be discharged home today with typical post-op instructions, returning in 10-15 days for reevaluation with new x-rays of  the affected wrist out of the splint to include an inclined lateral and then transition to therapy to have a custom [place] splint constructed (forearm semisupinated) and begin rehabilitation.

## 2019-12-20 NOTE — Anesthesia Procedure Notes (Signed)
Procedure Name: LMA Insertion Date/Time: 12/20/2019 7:42 AM Performed by: Lucinda Dell, CRNA Pre-anesthesia Checklist: Patient identified, Emergency Drugs available, Suction available and Patient being monitored Patient Re-evaluated:Patient Re-evaluated prior to induction Oxygen Delivery Method: Circle system utilized Preoxygenation: Pre-oxygenation with 100% oxygen Induction Type: IV induction Ventilation: Mask ventilation without difficulty LMA: LMA inserted LMA Size: 4.0 Number of attempts: 1 Placement Confirmation: positive ETCO2 and breath sounds checked- equal and bilateral Tube secured with: Tape Dental Injury: Teeth and Oropharynx as per pre-operative assessment

## 2019-12-20 NOTE — Anesthesia Procedure Notes (Signed)
Anesthesia Regional Block: Supraclavicular block   Pre-Anesthetic Checklist: ,, timeout performed, Correct Patient, Correct Site, Correct Laterality, Correct Procedure, Correct Position, site marked, Risks and benefits discussed,  Surgical consent,  Pre-op evaluation,  At surgeon's request and post-op pain management  Laterality: Right  Prep: chloraprep       Needles:  Injection technique: Single-shot  Needle Type: Echogenic Needle     Needle Length: 9cm      Additional Needles:   Procedures:,,,, ultrasound used (permanent image in chart),,,,  Narrative:  Start time: 12/20/2019 6:52 AM End time: 12/20/2019 6:59 AM Injection made incrementally with aspirations every 5 mL.  Performed by: Personally  Anesthesiologist: Eilene Ghazi, MD  Additional Notes: Patient tolerated the procedure well without complications

## 2019-12-20 NOTE — Anesthesia Procedure Notes (Signed)
Anesthesia Procedure Image    

## 2019-12-20 NOTE — Discharge Instructions (Signed)
Discharge Instructions   You have a dressing with a plaster splint incorporated in it. Move your fingers as much as possible, making a full fist and fully opening the fist. Elevate your hand to reduce pain & swelling of the digits.  Ice over the operative site may be helpful to reduce pain & swelling.  DO NOT USE HEAT. Pain medicine has been prescribed for you.  Take Tylenol 650 mg and Ibuprofen 600 mg every 6 hours. Take Oxycodone 5 mg as a rescue medicine for pain. Leave the dressing in place until you return to our office.  You may shower, but keep the bandage clean & dry.  You may drive a car when you are off of prescription pain medications and can safely control your vehicle with both hands. Our office will call you to arrange follow-up   Please call [phone] during normal business hours or [phone] after hours for any problems. Including the following:  - excessive redness of the incisions - drainage for more than 4 days - fever of more than 101.5 F  *Please note that pain medications will not be refilled after hours or on weekends.   WORK STATUS: May return to work on Monday 12/23/2019 with the following restrictions: No work with the right hand.    Post Anesthesia Home Care Instructions  Activity: Get plenty of rest for the remainder of the day. A responsible individual must stay with you for 24 hours following the procedure.  For the next 24 hours, DO NOT: -Drive a car -Advertising copywriter -Drink alcoholic beverages -Take any medication unless instructed by your physician -Make any legal decisions or sign important papers.  Meals: Start with liquid foods such as gelatin or soup. Progress to regular foods as tolerated. Avoid greasy, spicy, heavy foods. If nausea and/or vomiting occur, drink only clear liquids until the nausea and/or vomiting subsides. Call your physician if vomiting continues.  Special Instructions/Symptoms: Your throat may feel dry or sore from  the anesthesia or the breathing tube placed in your throat during surgery. If this causes discomfort, gargle with warm salt water. The discomfort should disappear within 24 hours.  If you had a scopolamine patch placed behind your ear for the management of post- operative nausea and/or vomiting:  1. The medication in the patch is effective for 72 hours, after which it should be removed.  Wrap patch in a tissue and discard in the trash. Wash hands thoroughly with soap and water. 2. You may remove the patch earlier than 72 hours if you experience unpleasant side effects which may include dry mouth, dizziness or visual disturbances. 3. Avoid touching the patch. Wash your hands with soap and water after contact with the patch.      Regional Anesthesia Blocks  1. Numbness or the inability to move the "blocked" extremity may last from 3-48 hours after placement. The length of time depends on the medication injected and your individual response to the medication. If the numbness is not going away after 48 hours, call your surgeon.  2. The extremity that is blocked will need to be protected until the numbness is gone and the  Strength has returned. Because you cannot feel it, you will need to take extra care to avoid injury. Because it may be weak, you may have difficulty moving it or using it. You may not know what position it is in without looking at it while the block is in effect.  3. For blocks in the legs and feet,  returning to weight bearing and walking needs to be done carefully. You will need to wait until the numbness is entirely gone and the strength has returned. You should be able to move your leg and foot normally before you try and bear weight or walk. You will need someone to be with you when you first try to ensure you do not fall and possibly risk injury.  4. Bruising and tenderness at the needle site are common side effects and will resolve in a few days.  5. Persistent numbness or new  problems with movement should be communicated to the surgeon or the [facility] [phone] [facility] [phone]).

## 2019-12-23 ENCOUNTER — Encounter: Payer: Self-pay | Admitting: *Deleted

## 2020-01-01 ENCOUNTER — Other Ambulatory Visit: Payer: Self-pay

## 2020-01-01 ENCOUNTER — Ambulatory Visit: Payer: No Typology Code available for payment source | Attending: Orthopedic Surgery | Admitting: Occupational Therapy

## 2020-01-01 ENCOUNTER — Encounter: Payer: Self-pay | Admitting: *Deleted

## 2020-01-01 DIAGNOSIS — M25531 Pain in right wrist: Secondary | ICD-10-CM

## 2020-01-01 DIAGNOSIS — R600 Localized edema: Secondary | ICD-10-CM

## 2020-01-01 DIAGNOSIS — M25631 Stiffness of right wrist, not elsewhere classified: Secondary | ICD-10-CM

## 2020-01-01 DIAGNOSIS — R278 Other lack of coordination: Secondary | ICD-10-CM

## 2020-01-01 DIAGNOSIS — M25621 Stiffness of right elbow, not elsewhere classified: Secondary | ICD-10-CM

## 2020-01-01 DIAGNOSIS — M6281 Muscle weakness (generalized): Secondary | ICD-10-CM
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
## 2020-01-01 NOTE — Patient Instructions (Signed)
WEARING SCHEDULE:  Wear splint at ALL times except for hygiene care.  Do not rotate your forearm palm down or up.   Move your shoulder and elbow within the splint (bend and extend your elbow).  PURPOSE:  To prevent movement and for protection until injury can heal  CARE OF SPLINT:  Keep splint away from heat sources including: stove, radiator or furnace, or a car in sunlight. The splint can melt and will no longer fit you properly  Keep away from pets and children  Clean the splint with rubbing alcohol 1 time per day.  * During this time, make sure you also clean your hand/arm as instructed by your therapist and/or perform dressing changes as needed. Then dry hand/arm completely before replacing splint. (When cleaning hand/arm, keep it immobilized in same position until splint is replaced)  PRECAUTIONS/POTENTIAL PROBLEMS: *If you notice or experience increased pain, swelling, numbness, or a lingering reddened area from the splint: Contact your therapist immediately by calling [phone]. You must wear the splint for protection, but we will get you scheduled for adjustments as quickly as possible.  (If only straps or hooks need to be replaced and NO adjustments to the splint need to be made, just call the office ahead and let them know you are coming in)  If you have any medical concerns or signs of infection, please call your doctor immediately

## 2020-01-09 ENCOUNTER — Other Ambulatory Visit: Payer: Self-pay

## 2020-01-09 ENCOUNTER — Ambulatory Visit: Payer: No Typology Code available for payment source | Attending: Orthopedic Surgery | Admitting: Occupational Therapy

## 2020-01-09 DIAGNOSIS — M25631 Stiffness of right wrist, not elsewhere classified: Secondary | ICD-10-CM | POA: Diagnosis present

## 2020-01-09 DIAGNOSIS — R278 Other lack of coordination: Secondary | ICD-10-CM | POA: Insufficient documentation

## 2020-01-09 DIAGNOSIS — M25531 Pain in right wrist: Secondary | ICD-10-CM | POA: Diagnosis present

## 2020-01-09 DIAGNOSIS — M25621 Stiffness of right elbow, not elsewhere classified: Secondary | ICD-10-CM | POA: Diagnosis present

## 2020-01-09 DIAGNOSIS — M6281 Muscle weakness (generalized): Secondary | ICD-10-CM | POA: Insufficient documentation

## 2020-01-09 NOTE — Therapy (Signed)
[facility] [address] [place], [place], [address] Phone: [phone]   Fax:  [phone]  Occupational Therapy Treatment  Patient Details  Name: Kyle Zamora MRN: [id number] Date of Birth: 1961/01/19 Referring Provider (OT): Dr Grandville Silos   Encounter Date: 01/09/2020  OT End of Session - 01/09/20 1157    Visit Number  2    Number of Visits  12    Date for OT Re-Evaluation  01/29/20    Authorization Type  Pt is curently self pay - He will inquire about financial assistance through the hospital as he does not currently have insurance and is unable to work. He may apply for Medicaid    OT Start Time  1115   pt arrived 15 min late   OT Stop Time  1150    OT Time Calculation (min)  35 min    Activity Tolerance  Patient tolerated treatment well    Behavior During Therapy  WFL for tasks assessed/performed       Past Medical History:  Diagnosis Date  . Hypertension     Past Surgical History:  Procedure Laterality Date  . OPEN REDUCTION INTERNAL FIXATION (ORIF) DISTAL RADIAL FRACTURE Right 12/20/2019   Procedure: OPEN REDUCTION INTERNAL FIXATION (ORIF) RIGHT RADIUS SHAFT;  Surgeon: Milly Jakob, MD;  Location: [facility];  Service: Orthopedics;  Laterality: Right;  PRE-OP BLOCK  . OTHER SURGICAL HISTORY     "skull surgery as an infant"    There were no vitals filed for this visit.  Subjective Assessment - 01/09/20 1119    Subjective   I forgot to bring any updates from the doctor. I don't know if I have any future appointments with the doctor    Pertinent History  ORIF Rt radial shaft fx (Galeazzi injury) w/ closed reduction of DRUJ on 12/20/19. Pt with h/o HTN    Limitations  NO strengthening, no sup/pron or wrist flex/ext until cleared by MD    Patient Stated Goals  Pt wants to decrease pain and functional use Right UE    Currently in Pain?  No/denies       Pt saw MD following O.T. initial  evaluation and splint fabrication on 01/01/20 however pt could not recall what if any updates there were re: progression of therapy. Pt did not when his next follow up was with MD. Pt also arrived 15 min late to O.T. appointment today.  Pt was missing proximal strap to elbow w/ 1 hook off - replaced hook and strap. Reviewed wear and care of splint, hygiene care, and precautions with patient.  Pt issued formal HEP for non affected joints to prevent stiffness and control edema - see pt instructions for details. Pt return demo of each x 10 reps. Pt instructed to perform this HEP inside splint for protection.   Called placed to MD office after pt's appointment for further clarification on progression of therapy - awaiting return call.   **Update received after treatment session that pt may begin pronation/supination and wrist flex/ext now (with splint on b/t exercise sessions) - scanned in Epic. Pt in [place] splint for first 6 weeks post-op then reduce to short arm splint. Pt sees MD again on 02/13/20.                   OT Education - 01/09/20 1135    Education Details  A/ROM HEP for shoulder, elbow flex/ext only, and fingers/thumb in splint (keeping forearm and wrist movement immobolized)  Person(s) Educated  Patient    Methods  Explanation;Demonstration;Verbal cues;Handout    Comprehension  Verbalized understanding;Returned demonstration       OT Short Term Goals - 01/09/20 1159      OT SHORT TERM GOAL #1   Title  Pt will be Mod I splinting use, care and precautions R dominant UE    Baseline  Requires Min VC's    Time  6    Period  Weeks    Status  Achieved    Target Date  02/12/20      OT SHORT TERM GOAL #2   Title  Pt will be Mod I home exercise program R UE    Baseline  Req Min VC's    Time  6    Period  Weeks    Status  On-going    Target Date  02/12/20      OT SHORT TERM GOAL #3   Title  Pt will be Mod I edema control R UE    Baseline  Req Min VC's    Time  6     Period  Weeks    Status  On-going    Target Date  02/12/20      OT SHORT TERM GOAL #4   Title  Pt will be Mod I scar management & desensitization PRN R volar wrist    Baseline  dependent    Time  6    Period  Weeks    Status  New    Target Date  02/12/20        OT Long Term Goals - 01/01/20 1143      OT LONG TERM GOAL #1   Title  Pt will be Mod I upgraded splinting use, care and precautions R UE    Baseline  Dependent    Time  12    Period  Weeks    Status  New    Target Date  03/25/20      OT LONG TERM GOAL #2   Title  Pt will be Mod I upgraded HEP R UE/wrist    Baseline  dependent    Time  12    Period  Weeks    Status  New    Target Date  03/25/20      OT LONG TERM GOAL #3   Title  Pt will demonstrate 10# or greater grip strength right dominant UE as seen by JAMAR assessment in preparation for work tasks    Baseline  Unable/dependent    Time  12    Period  Weeks    Status  New    Target Date  03/25/20      OT LONG TERM GOAL #4   Title  Pt will rate pain as 3/10 or less during ADL's and simulated work tasks using R UE    Baseline  Unable/dependent    Time  12    Period  Weeks    Status  New    Target Date  03/25/20            Plan - 01/09/20 1200    Clinical Impression Statement  Pt returns today for follow up following initial eval and splint fabrication. Reviewed wear and care and precautions as well as issueing formal HEP for A/ROM of non affected joints to prevent stiffness and for edema control.    Occupational performance deficits (Please refer to evaluation for details):  ADL's;IADL's;Work    Body Structure / Function / Physical  Skills  ADL;Dexterity;ROM;Edema;Scar mobility;Mobility;Flexibility;Strength;Coordination;FMC;Pain;UE functional use    Rehab Potential  Good    Comorbidities Affecting Occupational Performance:  None    OT Frequency  1x / week    OT Duration  12 weeks    OT Treatment/Interventions  Self-care/ADL  training;Fluidtherapy;Splinting;Therapeutic activities;Therapeutic exercise;Scar mobilization;Passive range of motion;Paraffin;Patient/family education;Manual Therapy;Cryotherapy;Moist Heat    Plan  progress as able (placed call to MD today to clarify if we can begin A/ROM to wrist and forearm at 4 weeks post-op) - update: can begin sup/pron and wrist flex/ext now, continue [place] splint b/t exercises first 6 weeks post-op then reduce to short arm splint   Consulted and Agree with Plan of Care  Patient       Patient will benefit from skilled therapeutic intervention in order to improve the following deficits and impairments:   Body Structure / Function / Physical Skills: ADL, Dexterity, ROM, Edema, Scar mobility, Mobility, Flexibility, Strength, Coordination, FMC, Pain, UE functional use       Visit Diagnosis: Stiffness of right elbow, not elsewhere classified  Other lack of coordination  Pain in right wrist    Problem List There are no problems to display for this patient.   Kelli Churn, OTR/L 01/09/2020, 12:06 PM  [facility] [address] [place], [place], [address] Phone: [phone]   Fax:  [phone]  Name: Nikalas Bramel MRN: [id number] Date of Birth: Jun 03, 1961

## 2020-01-09 NOTE — Patient Instructions (Signed)
  PERFORM BELOW EXERCISES WITH SPLINT ON  SHOULDER: Flexion Unilateral    Raise RIGHT arm overhead. Keep thumb pointed up. _10__ reps per set, _3__ sets per day   AROM: Elbow Flexion / Extension    With ARM IN SPLINT, gently bend elbow as far as possible. Then straighten arm as far as able. Repeat _10___ times per set.  Do __3__ sessions per day.  Flexor Tendon Gliding (Active Hook Fist)   With fingers and knuckles straight, bend middle and tip joints. Do not bend large knuckles. Repeat _10___ times. Do _3__ sessions per day.      Finger Flexion / Extension  IN SPLINT - Bend fingers of right hand toward palm, making a fist. Straighten fingers, opening fist. Repeat sequence _10___ times per session. Do _3_ sessions per day.  Finger Opposition    Actively touch right thumb to each fingertip. Start with index finger and proceed toward little finger. Open hand big between each Repeat __10__ times per set. Do __3__ sessions per day.

## 2020-01-13 ENCOUNTER — Other Ambulatory Visit: Payer: Self-pay

## 2020-01-13 ENCOUNTER — Ambulatory Visit: Payer: No Typology Code available for payment source | Admitting: Occupational Therapy

## 2020-01-13 DIAGNOSIS — M25631 Stiffness of right wrist, not elsewhere classified: Secondary | ICD-10-CM

## 2020-01-13 DIAGNOSIS — R278 Other lack of coordination: Secondary | ICD-10-CM

## 2020-01-13 DIAGNOSIS — M25621 Stiffness of right elbow, not elsewhere classified: Secondary | ICD-10-CM | POA: Diagnosis not present

## 2020-01-13 DIAGNOSIS — M25531 Pain in right wrist: Secondary | ICD-10-CM

## 2020-01-13 NOTE — Patient Instructions (Signed)
  Elbow / Wrist Supination / Pronation    Bend Rt elbow to 90 and hold close to body. Turn Rt palm up. Then turn palm down. Keep wrist straight. Repeat sequence _10___ times per session. Do __6__ sessions per day.   Wrist Flexion / Extension    Hold Rt elbow at 90 and close to body, with palm down. Bend right wrist so fingers point up. Then bend wrist so fingers point down. Repeat sequence __10__ times per session. Do _6___ sessions per day.  Hand Variation: Thumbs side up   SCAR MASSAGE:  Use cocoa butter and massage in deep circles along incision for 5 minutes, 2x/day

## 2020-01-13 NOTE — Therapy (Signed)
[facility] [address] [place], [place], [address] Phone: [phone]   Fax:  [phone]  Occupational Therapy Treatment  Patient Details  Name: Kyle Zamora MRN: [id number] Date of Birth: 02-25-1961 Referring Provider (OT): Dr Janee Morn   Encounter Date: 01/13/2020  OT End of Session - 01/13/20 1352    Visit Number  3    Number of Visits  12    Date for OT Re-Evaluation  01/29/20    Authorization Type  Pt is curently self pay - He will inquire about financial assistance through the hospital as he does not currently have insurance and is unable to work. He may apply for Medicaid    OT Start Time  1315    OT Stop Time  1345    OT Time Calculation (min)  30 min    Activity Tolerance  Patient tolerated treatment well    Behavior During Therapy  WFL for tasks assessed/performed       Past Medical History:  Diagnosis Date  . Hypertension     Past Surgical History:  Procedure Laterality Date  . OPEN REDUCTION INTERNAL FIXATION (ORIF) DISTAL RADIAL FRACTURE Right 12/20/2019   Procedure: OPEN REDUCTION INTERNAL FIXATION (ORIF) RIGHT RADIUS SHAFT;  Surgeon: Mack Hook, MD;  Location: [facility];  Service: Orthopedics;  Laterality: Right;  PRE-OP BLOCK  . OTHER SURGICAL HISTORY     "skull surgery as an infant"    There were no vitals filed for this visit.  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 Pt with h/o HTN    Limitations  NO strengthening, munster splint for first 6 weeks, ok to begin sup/pron and wrist flex/ext - splint on between ex's    Patient Stated Goals  Pt wants to decrease pain and functional use Right UE    Currently in Pain?  No/denies       Pt began A/ROM in sup/pron and wrist flex/ext (per MD clearance - see updated order in Epic) and pt returned demo of each x 15 reps. Pt unable  to perform full pronation, therefore performed wrist flex/ext with forearm in neutral.  Pt reports his incision has mild odor secondary to not washing over incision. Therapist instructed pt he can now wash directly over incision since all closed and no open wounds. Therapist washed forearm and had patient rinse all soap off. Pt also instructed in scar massage. Pt verbalized understanding.                     OT Education - 01/13/20 1341    Education Details  HEP for wrist and forearm A/ROM, scar massage    Person(s) Educated  Patient    Methods  Explanation;Demonstration;Verbal cues;Handout    Comprehension  Verbalized understanding;Returned demonstration       OT Short Term Goals - 01/13/20 1353      OT SHORT TERM GOAL #1   Title  Pt will be Mod I splinting use, care and precautions R dominant UE    Baseline  Requires Min VC's    Time  6    Period  Weeks    Status  Achieved    Target Date  02/12/20      OT SHORT TERM GOAL #2   Title  Pt will be Mod I home exercise program R UE    Baseline  Req  Min VC's    Time  6    Period  Weeks    Status  Achieved    Target Date  02/12/20      OT SHORT TERM GOAL #3   Title  Pt will be Mod I edema control R UE    Baseline  Req Min VC's    Time  6    Period  Weeks    Status  On-going    Target Date  02/12/20      OT SHORT TERM GOAL #4   Title  Pt will be Mod I scar management & desensitization PRN R volar wrist    Baseline  dependent    Time  6    Period  Weeks    Status  On-going    Target Date  02/12/20        OT Long Term Goals - 01/01/20 1143      OT LONG TERM GOAL #1   Title  Pt will be Mod I upgraded splinting use, care and precautions R UE    Baseline  Dependent    Time  12    Period  Weeks    Status  New    Target Date  03/25/20      OT LONG TERM GOAL #2   Title  Pt will be Mod I upgraded HEP R UE/wrist    Baseline  dependent    Time  12    Period  Weeks    Status  New    Target Date  03/25/20       OT LONG TERM GOAL #3   Title  Pt will demonstrate 10# or greater grip strength right dominant UE as seen by JAMAR assessment in preparation for work tasks    Baseline  Unable/dependent    Time  12    Period  Weeks    Status  New    Target Date  03/25/20      OT LONG TERM GOAL #4   Title  Pt will rate pain as 3/10 or less during ADL's and simulated work tasks using R UE    Baseline  Unable/dependent    Time  12    Period  Weeks    Status  New    Target Date  03/25/20            Plan - 01/13/20 1354    Clinical Impression Statement  Pt progressing with A/ROM. Pt with overall decreased edema    Occupational performance deficits (Please refer to evaluation for details):  ADL's;IADL's;Work    Body Structure / Function / Physical Skills  ADL;Dexterity;ROM;Edema;Scar mobility;Mobility;Flexibility;Strength;Coordination;FMC;Pain;UE functional use    Rehab Potential  Good    Comorbidities Affecting Occupational Performance:  None    OT Frequency  1x / week    OT Duration  12 weeks    OT Treatment/Interventions  Self-care/ADL training;Fluidtherapy;Splinting;Therapeutic activities;Therapeutic exercise;Scar mobilization;Passive range of motion;Paraffin;Patient/family education;Manual Therapy;Cryotherapy;Moist Heat    Plan  pt progressing to A/ROM of involved joints today per clearance from MD - continue ROM to wrist and forearm, review scar massage    Consulted and Agree with Plan of Care  Patient       Patient will benefit from skilled therapeutic intervention in order to improve the following deficits and impairments:   Body Structure / Function / Physical Skills: ADL, Dexterity, ROM, Edema, Scar mobility, Mobility, Flexibility, Strength, Coordination, FMC, Pain, UE functional use       Visit Diagnosis: Stiffness  of right elbow, not elsewhere classified  Pain in right wrist  Other lack of coordination  Stiffness of joint, forearm, right    Problem List There are no  problems to display for this patient.   Carey Bullocks, OTR/L 01/13/2020, 1:56 PM  [facility] [address] [place], [place], [address] Phone: [phone]   Fax:  [phone]  Name: Kyle Zamora MRN: [id number] Date of Birth: 1960/12/18

## 2020-01-23 ENCOUNTER — Ambulatory Visit: Payer: No Typology Code available for payment source | Admitting: Occupational Therapy

## 2020-01-23 ENCOUNTER — Other Ambulatory Visit: Payer: Self-pay

## 2020-01-23 DIAGNOSIS — M25621 Stiffness of right elbow, not elsewhere classified: Secondary | ICD-10-CM

## 2020-01-23 DIAGNOSIS — M25531 Pain in right wrist: Secondary | ICD-10-CM

## 2020-01-23 NOTE — Therapy (Signed)
[facility] [address] [place], [place], [address] Phone: [phone]   Fax:  [phone]  Occupational Therapy Treatment  Patient Details  Name: Kyle Zamora MRN: [id number] Date of Birth: [birth year] Referring Provider (OT): Dr Janee Morn   Encounter Date: 01/23/2020  OT End of Session - 01/23/20 1313    Visit Number  4    Number of Visits  12    Date for OT Re-Evaluation  01/29/20    Authorization Type  Pt is curently self pay - He will inquire about financial assistance through the hospital as he does not currently have insurance and is unable to work. He may apply for Medicaid    OT Start Time  1230    OT Stop Time  1310    OT Time Calculation (min)  40 min    Activity Tolerance  Patient tolerated treatment well    Behavior During Therapy  WFL for tasks assessed/performed       Past Medical History:  Diagnosis Date  . Hypertension     Past Surgical History:  Procedure Laterality Date  . OPEN REDUCTION INTERNAL FIXATION (ORIF) DISTAL RADIAL FRACTURE Right 12/20/2019   Procedure: OPEN REDUCTION INTERNAL FIXATION (ORIF) RIGHT RADIUS SHAFT;  Surgeon: Mack Hook, MD;  Location: [facility];  Service: Orthopedics;  Laterality: Right;  PRE-OP BLOCK  . OTHER SURGICAL HISTORY     "skull surgery as an infant"    There were no vitals filed for this visit.  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 Pt with h/o HTN    Limitations  NO strengthening, munster splint for first 6 weeks, ok to begin sup/pron and wrist flex/ext - splint on between ex's    Patient Stated Goals  Pt wants to decrease pain and functional use Right UE    Currently in Pain?  No/denies         [facility] OT Assessment - 01/23/20 0001      ROM / Strength   AROM / PROM / Strength  AROM       AROM   Overall AROM Comments  RUE: sup = 80*, pron = 60*, wrist flex = 40*, ext = 35*               OT Treatments/Exercises (OP) - 01/23/20 0001      ADLs   ADL Comments  Reviewed scar massage while pt on hot pack. Pt also instructed in proper stockinette care as pt was putting in washing machine. Reviewed to hand wash only and lay flat to dry. Pt provided another tensogrip stockinette. Pt is scheduled to return prior to the 6 week mark next week - attempted to get pt scheduled later in the week as pt will be 6 week post op and can cut down splnt to forearm based and begin P/ROM (Pt to call and reschedule if possible)       Exercises   Exercises  Elbow;Wrist      Elbow Exercises   Elbow Flexion  AROM;10 reps    Elbow Extension  AROM;10 reps    Forearm Supination  AROM;15 reps    Forearm Pronation  AROM;15 reps      Wrist Exercises   Wrist Flexion  AROM;15 reps    Wrist Extension  AROM;15 reps  Other wrist exercises  Pt now able to do wrist flex and extension with forearm pronated due to increased ROM       Modalities   Modalities  Moist Heat      Moist Heat Therapy   Number Minutes Moist Heat  10 Minutes    Moist Heat Location  --   forearm     Splinting   Splinting  Added padding around thenar eminence of thumb and web space for greater comfort               OT Short Term Goals - 01/23/20 1316      OT SHORT TERM GOAL #1   Title  Pt will be Mod I splinting use, care and precautions R dominant UE    Baseline  Requires Min VC's    Time  6    Period  Weeks    Status  Achieved    Target Date  02/12/20      OT SHORT TERM GOAL #2   Title  Pt will be Mod I home exercise program R UE    Baseline  Req Min VC's    Time  6    Period  Weeks    Status  Achieved    Target Date  02/12/20      OT SHORT TERM GOAL #3   Title  Pt will be Mod I edema control R UE    Baseline  Req Min VC's    Time  6    Period  Weeks    Status  Achieved    Target Date   02/12/20      OT SHORT TERM GOAL #4   Title  Pt will be Mod I scar management & desensitization PRN R volar wrist    Baseline  dependent    Time  6    Period  Weeks    Status  Achieved    Target Date  02/12/20        OT Long Term Goals - 01/01/20 1143      OT LONG TERM GOAL #1   Title  Pt will be Mod I upgraded splinting use, care and precautions R UE    Baseline  Dependent    Time  12    Period  Weeks    Status  New    Target Date  03/25/20      OT LONG TERM GOAL #2   Title  Pt will be Mod I upgraded HEP R UE/wrist    Baseline  dependent    Time  12    Period  Weeks    Status  New    Target Date  03/25/20      OT LONG TERM GOAL #3   Title  Pt will demonstrate 10# or greater grip strength right dominant UE as seen by JAMAR assessment in preparation for work tasks    Baseline  Unable/dependent    Time  12    Period  Weeks    Status  New    Target Date  03/25/20      OT LONG TERM GOAL #4   Title  Pt will rate pain as 3/10 or less during ADL's and simulated work tasks using R UE    Baseline  Unable/dependent    Time  12    Period  Weeks    Status  New    Target Date  03/25/20  Plan - 01/23/20 1317    Clinical Impression Statement  Pt has made significant improvement in A/ROM to wrist and forearm (see measurements) and can now perform wrist flex/ext in forearm pronation.    Occupational performance deficits (Please refer to evaluation for details):  ADL's;IADL's;Work    Body Structure / Function / Physical Skills  ADL;Dexterity;ROM;Edema;Scar mobility;Mobility;Flexibility;Strength;Coordination;FMC;Pain;UE functional use    Rehab Potential  Good    Comorbidities Affecting Occupational Performance:  None    OT Frequency  1x / week    OT Duration  12 weeks    OT Treatment/Interventions  Self-care/ADL training;Fluidtherapy;Splinting;Therapeutic activities;Therapeutic exercise;Scar mobilization;Passive range of motion;Paraffin;Patient/family  education;Manual Therapy;Cryotherapy;Moist Heat    Plan  If pt able to reschedule to 01/30/20 then splint can be forearm based (per updated MD order scanned in EPIC) and issue P/ROM per protocol    Consulted and Agree with Plan of Care  Patient       Patient will benefit from skilled therapeutic intervention in order to improve the following deficits and impairments:   Body Structure / Function / Physical Skills: ADL, Dexterity, ROM, Edema, Scar mobility, Mobility, Flexibility, Strength, Coordination, FMC, Pain, UE functional use       Visit Diagnosis: Stiffness of right elbow, not elsewhere classified  Pain in right wrist    Problem List There are no problems to display for this patient.   Carey Bullocks, OTR/L 01/23/2020, 1:20 PM  [facility] [address] [place], [place], [address] Phone: [phone]   Fax:  [phone]  Name: Jiovanni Heeter MRN: [id number] Date of Birth: June 17, 1961

## 2020-01-28 ENCOUNTER — Ambulatory Visit: Payer: No Typology Code available for payment source | Admitting: Occupational Therapy

## 2020-01-29 ENCOUNTER — Encounter: Payer: Self-pay | Admitting: Occupational Therapy

## 2020-01-30 ENCOUNTER — Ambulatory Visit: Payer: No Typology Code available for payment source | Admitting: Occupational Therapy

## 2020-01-30 ENCOUNTER — Other Ambulatory Visit: Payer: Self-pay

## 2020-01-30 DIAGNOSIS — M25631 Stiffness of right wrist, not elsewhere classified: Secondary | ICD-10-CM

## 2020-01-30 DIAGNOSIS — R278 Other lack of coordination: Secondary | ICD-10-CM

## 2020-01-30 DIAGNOSIS — M25531 Pain in right wrist: Secondary | ICD-10-CM

## 2020-01-30 DIAGNOSIS — M25621 Stiffness of right elbow, not elsewhere classified: Secondary | ICD-10-CM | POA: Diagnosis not present

## 2020-01-30 DIAGNOSIS — M6281 Muscle weakness (generalized): Secondary | ICD-10-CM

## 2020-01-30 NOTE — Patient Instructions (Signed)
Continue with your previous exercises, wear your splint in between exercises and at night    PROM: Wrist Flexion / Extension   Grasp  hand and slowly bend wrist until stretch is felt. Relax. Then stretch as far as possible in opposite direction. Be sure to keep elbow bent.  Hold __10__ sec. each way Repeat _5___ times per set.    Do _4-6___ sessions per day.  Pronation (Passive)   Keep elbow bent at right angle and held firmly to side. Use other hand to turn forearm until palm faces downward. Hold _10___ seconds. Repeat __5__ times. Do _4-6___ sessions per day.  Supination (Passive)   Keep elbow bent at right angle and held firmly at side. Use other hand to turn forearm until palm faces upward. Hold __10__ seconds. Repeat __5__ times. Do _4-6___ sessions per day.  Copyright  VHI. All rights reserved.

## 2020-01-30 NOTE — Therapy (Signed)
[facility] [address] [place], [place], [address] Phone: [phone]   Fax:  [phone]  Occupational Therapy Treatment  Patient Details  Name: Kyle Zamora MRN: [id number] Date of Birth: 1961-07-25 Referring Provider (OT): Dr [place] Silos   Encounter Date: 01/30/2020  OT End of Session - 01/30/20 1146    Visit Number  5    Number of Visits  12    Date for OT Re-Evaluation  01/29/20    Authorization Type  Pt is curently self pay - He will inquire about financial assistance through the hospital as he does not currently have insurance and is unable to work. He may apply for Medicaid    OT Start Time  1018    OT Stop Time  1055    OT Time Calculation (min)  37 min    Activity Tolerance  Patient tolerated treatment well    Behavior During Therapy  WFL for tasks assessed/performed       Past Medical History:  Diagnosis Date  . Hypertension     Past Surgical History:  Procedure Laterality Date  . OPEN REDUCTION INTERNAL FIXATION (ORIF) DISTAL RADIAL FRACTURE Right 12/20/2019   Procedure: OPEN REDUCTION INTERNAL FIXATION (ORIF) RIGHT RADIUS SHAFT;  Surgeon: Milly Jakob, MD;  Location: [facility];  Service: Orthopedics;  Laterality: Right;  PRE-OP BLOCK  . OTHER SURGICAL HISTORY     "skull surgery as an infant"    There were no vitals filed for this visit.  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 Pt with h/o HTN    Limitations  NO strengthening, munster splint for first 6 weeks, ok to begin sup/pron and wrist flex/ext - splint on between ex's    Patient Stated Goals  Pt wants to decrease pain and functional use Right UE    Currently in Pain?  Yes    Pain Score  3     Pain Location  Arm    Pain Orientation  Right    Pain Descriptors / Indicators  Aching     Pain Type  Acute pain    Pain Onset  More than a month ago    Pain Frequency  Intermittent    Aggravating Factors   movement    Pain Relieving Factors  heat           Treatment: Pt arrived wearing muenster splint, per MD orders splint was modified to forearm based, additional padding added, while pt performed A/ROM in fluidotherapy x 10 mins for pain and stiffness, no adverse reactions. Pt's splint is fitting well following adjustments              OT Treatment/ Education - 01/30/20 1300    Education Details  ReviewedHEP for wrist , hand and forearm A/ROM, added P/ROm exercises, splint wear/ care following modifications    Person(s) Educated  Patient    Methods  Explanation;Demonstration;Verbal cues;Handout    Comprehension  Verbalized understanding;Returned demonstration       OT Short Term Goals - 01/30/20 1300      OT SHORT TERM GOAL #1   Title  Pt will be Mod I splinting use, care and precautions R dominant UE    Baseline  Requires Min VC's    Time  6    Period  Weeks  Status  Achieved    Target Date  02/12/20      OT SHORT TERM GOAL #2   Title  Pt will be Mod I home exercise program R UE    Baseline  Req Min VC's    Time  6    Period  Weeks    Status  Achieved    Target Date  02/12/20      OT SHORT TERM GOAL #3   Title  Pt will be Mod I edema control R UE    Baseline  Req Min VC's    Time  6    Period  Weeks    Status  Achieved    Target Date  02/12/20      OT SHORT TERM GOAL #4   Title  Pt will be Mod I scar management & desensitization PRN R volar wrist    Baseline  dependent    Time  6    Period  Weeks    Status  Achieved    Target Date  02/12/20        OT Long Term Goals - 01/01/20 1143      OT LONG TERM GOAL #1   Title  Pt will be Mod I upgraded splinting use, care and precautions R UE    Baseline  Dependent    Time  12    Period  Weeks    Status  New    Target Date  03/25/20      OT LONG TERM GOAL #2   Title  Pt will be  Mod I upgraded HEP R UE/wrist    Baseline  dependent    Time  12    Period  Weeks    Status  New    Target Date  03/25/20      OT LONG TERM GOAL #3   Title  Pt will demonstrate 10# or greater grip strength right dominant UE as seen by JAMAR assessment in preparation for work tasks    Baseline  Unable/dependent    Time  12    Period  Weeks    Status  New    Target Date  03/25/20      OT LONG TERM GOAL #4   Title  Pt will rate pain as 3/10 or less during ADL's and simulated work tasks using R UE    Baseline  Unable/dependent    Time  12    Period  Weeks    Status  New    Target Date  03/25/20            Plan - 01/30/20 1258    Clinical Impression Statement  Pt is progressing towards goals.Pt's splint was cut down to forearm based as per MD instuctions.    Occupational performance deficits (Please refer to evaluation for details):  ADL's;IADL's;Work    Body Structure / Function / Physical Skills  ADL;Dexterity;ROM;Edema;Scar mobility;Mobility;Flexibility;Strength;Coordination;FMC;Pain;UE functional use    Rehab Potential  Good    Comorbidities Affecting Occupational Performance:  None    OT Frequency  1x / week    OT Duration  12 weeks    OT Treatment/Interventions  Self-care/ADL training;Fluidtherapy;Splinting;Therapeutic activities;Therapeutic exercise;Scar mobilization;Passive range of motion;Paraffin;Patient/family education;Manual Therapy;Cryotherapy;Moist Heat    Plan  review A/ROM, P/ROM exercises    Consulted and Agree with Plan of Care  Patient       Patient will benefit from skilled therapeutic intervention in order to improve the following deficits and impairments:   Body Structure /  Function / Physical Skills: ADL, Dexterity, ROM, Edema, Scar mobility, Mobility, Flexibility, Strength, Coordination, FMC, Pain, UE functional use       Visit Diagnosis: Stiffness of right elbow, not elsewhere classified  Pain in right wrist  Other lack of  coordination  Muscle weakness (generalized)  Stiffness of joint, forearm, right    Problem List There are no problems to display for this patient.   Karmah Potocki 01/30/2020, 1:02 PM  [facility] [address] [place], [place], [address] Phone: [phone]   Fax:  [phone]  Name: Kyle Zamora MRN: [id number] Date of Birth: 1961/10/28

## 2020-02-13 ENCOUNTER — Ambulatory Visit: Payer: No Typology Code available for payment source | Attending: Orthopedic Surgery | Admitting: Occupational Therapy

## 2020-02-13 ENCOUNTER — Other Ambulatory Visit: Payer: Self-pay

## 2020-02-13 DIAGNOSIS — M25531 Pain in right wrist: Secondary | ICD-10-CM | POA: Insufficient documentation

## 2020-02-13 DIAGNOSIS — M6281 Muscle weakness (generalized): Secondary | ICD-10-CM | POA: Diagnosis present

## 2020-02-13 DIAGNOSIS — R278 Other lack of coordination: Secondary | ICD-10-CM | POA: Diagnosis present

## 2020-02-13 DIAGNOSIS — M25621 Stiffness of right elbow, not elsewhere classified: Secondary | ICD-10-CM | POA: Insufficient documentation

## 2020-02-13 DIAGNOSIS — R600 Localized edema: Secondary | ICD-10-CM | POA: Diagnosis present

## 2020-02-13 DIAGNOSIS — M25631 Stiffness of right wrist, not elsewhere classified: Secondary | ICD-10-CM | POA: Diagnosis present

## 2020-02-13 NOTE — Therapy (Signed)
[facility] [address] [place], [place], [address] Phone: [phone]   Fax:  [phone]  Occupational Therapy Treatment  Patient Details  Name: Kyle Zamora MRN: [id number] Date of Birth: [birth year] Referring Provider (OT): Dr Grandville Silos   Encounter Date: 02/13/2020  OT End of Session - 02/13/20 1454    Visit Number  6    Number of Visits  12    Date for OT Re-Evaluation  01/29/20    Authorization Type  Pt is curently self pay - He will inquire about financial assistance through the hospital as he does not currently have insurance and is unable to work. He may apply for Medicaid    OT Start Time  1403    OT Stop Time  1445    OT Time Calculation (min)  42 min    Activity Tolerance  Patient tolerated treatment well       Past Medical History:  Diagnosis Date  . Hypertension     Past Surgical History:  Procedure Laterality Date  . OPEN REDUCTION INTERNAL FIXATION (ORIF) DISTAL RADIAL FRACTURE Right 12/20/2019   Procedure: OPEN REDUCTION INTERNAL FIXATION (ORIF) RIGHT RADIUS SHAFT;  Surgeon: Milly Jakob, MD;  Location: [facility];  Service: Orthopedics;  Laterality: Right;  PRE-OP BLOCK  . OTHER SURGICAL HISTORY     "skull surgery as an infant"    There were no vitals filed for this visit.  Subjective Assessment - 02/13/20 1409    Subjective   MD released patient to return to work with splint on and no lifting > 15 lbs (therapist saw paper that MD gave pt). Pt reports MD also tested grip strength at clinic and it was 75 lbs (Lt = 125 lbs). I've been working out and lifting a 2 Lt soda bottle.    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 Pt with h/o HTN    Limitations  munster splint for first 6 weeks, ok to begin sup/pron and wrist flex/ext - splint on between ex's, strengthening started at 8 weeks post-op    Currently in Pain?  No/denies        Assessed progress towards goals. Grip strength Rt = 88 lbs (75 lbs at MD office), Lt = 128 lbs.  Reviewed A/ROM and P/ROM HEP. Began light strengthening today as pt is now 8 weeks post-op. Pt performing elbow flex/ext w/ 4 lb weight and forearm sup/pron with light hammer with splint on, then removed splint to perform light wrist strengthening in flex/ext with 2 lb weight, and putty ex's for grip and pinch strength with green resistance putty (issued for home use) - see pt instructions for details.  Pt wishes to d/c at next session.                     OT Education - 02/13/20 1424    Education Details  Strengthening HEP    Person(s) Educated  Patient    Methods  Explanation;Demonstration;Verbal cues;Handout    Comprehension  Verbalized understanding;Returned demonstration       OT Short Term Goals - 01/30/20 1300      OT SHORT TERM GOAL #1   Title  Pt will be Mod I splinting use, care and precautions R dominant UE    Baseline  Requires Min VC's    Time  6    Period  Weeks    Status  Achieved  Target Date  02/12/20      OT SHORT TERM GOAL #2   Title  Pt will be Mod I home exercise program R UE    Baseline  Req Min VC's    Time  6    Period  Weeks    Status  Achieved    Target Date  02/12/20      OT SHORT TERM GOAL #3   Title  Pt will be Mod I edema control R UE    Baseline  Req Min VC's    Time  6    Period  Weeks    Status  Achieved    Target Date  02/12/20      OT SHORT TERM GOAL #4   Title  Pt will be Mod I scar management & desensitization PRN R volar wrist    Baseline  dependent    Time  6    Period  Weeks    Status  Achieved    Target Date  02/12/20        OT Long Term Goals - 02/13/20 1429      OT LONG TERM GOAL #1   Title  Pt will be Mod I upgraded splinting use, care and precautions R UE    Baseline  Dependent    Time  12    Period  Weeks    Status  Achieved      OT LONG TERM GOAL #2   Title  Pt will be Mod I upgraded HEP  R UE/wrist    Baseline  dependent    Time  12    Period  Weeks    Status  On-going      OT LONG TERM GOAL #3   Title  Pt will demonstrate 10# or greater grip strength right dominant UE as seen by JAMAR assessment in preparation for work tasks    Baseline  Unable/dependent    Time  12    Period  Weeks    Status  On-going   Rt = 88 lbs, Lt = 128 lbs     OT LONG TERM GOAL #4   Title  Pt will rate pain as 3/10 or less during ADL's and simulated work tasks using R UE    Baseline  Unable/dependent    Time  12    Period  Weeks    Status  On-going   met for ADLS, just released to go back to work           Plan - 02/13/20 1430    Clinical Impression Statement  Pt now 8 weeks post op. Pt has been released to return to work with splint on and no lifting > 15 lbs. Pt progressed to light strengthening in therapy today - tolerating well.    Occupational performance deficits (Please refer to evaluation for details):  ADL's;IADL's;Work    Body Structure / Function / Physical Skills  ADL;Dexterity;ROM;Edema;Scar mobility;Mobility;Flexibility;Strength;Coordination;FMC;Pain;UE functional use    Rehab Potential  Good    Comorbidities Affecting Occupational Performance:  None    OT Frequency  1x / week    OT Duration  12 weeks    OT Treatment/Interventions  Self-care/ADL training;Fluidtherapy;Splinting;Therapeutic activities;Therapeutic exercise;Scar mobilization;Passive range of motion;Paraffin;Patient/family education;Manual Therapy;Cryotherapy;Moist Heat    Plan  review strengthening HEP, reassess grip strength and ROM at forearm and wrist, upgrade to blue putty, anticipate d/c per pt request    Consulted and Agree with Plan of Care  Patient  Patient will benefit from skilled therapeutic intervention in order to improve the following deficits and impairments:   Body Structure / Function / Physical Skills: ADL, Dexterity, ROM, Edema, Scar mobility, Mobility, Flexibility, Strength,  Coordination, FMC, Pain, UE functional use       Visit Diagnosis: Stiffness of right elbow, not elsewhere classified  Stiffness of joint, forearm, right  Muscle weakness (generalized)    Problem List There are no problems to display for this patient.   Carey Bullocks, OTR/L 02/13/2020, 2:55 PM  [facility] [address] [place], [place], [address] Phone: [phone]   Fax:  [phone]  Name: Tait Balistreri MRN: [id number] Date of Birth: 1960-11-25

## 2020-02-13 NOTE — Patient Instructions (Signed)
  DO FIRST 2 EXERCISES WITH SPLINT ON:  Flexion (Resistive)    Hold a can weighing 4 lbs in hand and bend elbow, keeping wrist straight. Support elbow with folded towel on table, or hold close to body. Hold __3__ seconds. Repeat _10-15___ times. Do __2__ sessions per day.  Pronation / Supination (Resistive)    Hold hammer weighing _16___ ounces and rotate palm up and down. Keep elbow flexed at side and wrist straight. Repeat __10__ times each way. Do _2___ sessions per day.    Extension (Resistive)    With wrist over edge of table, lift _2 lbs, keeping arm on table surface. Hold __3__ seconds. Lower slowly. Repeat __10__ times. Do __2__ sessions per day.  Flexion (Resistive)    With hand palm-up and holding __2__lbs, bend hand toward you at wrist. Hold _3___ seconds. Relax slowly. Repeat _10___ times. Do _2___ sessions per day.   1. Grip Strengthening (Resistive Putty)   Squeeze putty using thumb and all fingers. Repeat _20___ times. Do __2__ sessions per day.   2. Roll putty into tube on table and pinch between first two fingers and thumb x 10 reps. Do 2 sessions per day.

## 2020-02-19 ENCOUNTER — Other Ambulatory Visit: Payer: Self-pay

## 2020-02-19 ENCOUNTER — Ambulatory Visit: Payer: No Typology Code available for payment source | Admitting: Occupational Therapy

## 2020-02-19 DIAGNOSIS — M6281 Muscle weakness (generalized): Secondary | ICD-10-CM

## 2020-02-19 DIAGNOSIS — M25531 Pain in right wrist: Secondary | ICD-10-CM

## 2020-02-19 DIAGNOSIS — M25631 Stiffness of right wrist, not elsewhere classified: Secondary | ICD-10-CM

## 2020-02-19 DIAGNOSIS — M25621 Stiffness of right elbow, not elsewhere classified: Secondary | ICD-10-CM | POA: Diagnosis not present

## 2020-02-19 DIAGNOSIS — R600 Localized edema: Secondary | ICD-10-CM

## 2020-02-19 DIAGNOSIS — R278 Other lack of coordination: Secondary | ICD-10-CM

## 2020-02-19 NOTE — Therapy (Addendum)
[facility] [address] [place], [place], [address] Phone: [phone]   Fax:  [phone]  Occupational Therapy Treatment  Patient Details  Name: Kyle Zamora MRN: [id number] Date of Birth: [birth year] Referring Provider (OT): Dr Grandville Silos   Encounter Date: 02/19/2020  OT End of Session - 02/19/20 1408    Visit Number  7    Number of Visits  12    Date for OT Re-Evaluation  01/29/20    Authorization Type  Pt is curently self pay - He will inquire about financial assistance through the hospital as he does not currently have insurance and is unable to work. He may apply for Medicaid    OT Start Time  1405   d/c visit   OT Stop Time  1440    OT Time Calculation (min)  35 min    Activity Tolerance  Patient tolerated treatment well       Past Medical History:  Diagnosis Date  . Hypertension     Past Surgical History:  Procedure Laterality Date  . OPEN REDUCTION INTERNAL FIXATION (ORIF) DISTAL RADIAL FRACTURE Right 12/20/2019   Procedure: OPEN REDUCTION INTERNAL FIXATION (ORIF) RIGHT RADIUS SHAFT;  Surgeon: Milly Jakob, MD;  Location: [facility];  Service: Orthopedics;  Laterality: Right;  PRE-OP BLOCK  . OTHER SURGICAL HISTORY     "skull surgery as an infant"    There were no vitals filed for this visit.  Subjective Assessment - 02/19/20 1421    Subjective   MD released patient to return to work with splint on and no lifting > 15 lbs (therapist saw paper that MD gave pt). Pt reports MD also tested grip strength at clinic and it was 75 lbs (Lt = 125 lbs). I've been working out and lifting a 2 Lt soda bottle.    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 Pt with h/o HTN    Limitations  munster splint for first 6 weeks, ok to begin sup/pron and wrist flex/ext - splint on between ex's, strengthening started at 8 weeks post-op    Currently in  Pain?  No/denies               Treatment: Checked progress towards goals. Pt. Agrees with d/c .  Wrist flex 60, ext 45             OT Education - 02/19/20 1414    Education Details  Strengthening HEP- review  10 reps each, Upgraded putty HEP to blue, pt returned demonstration of exercises for grip and pinch   Person(s) Educated  Patient    Methods  Explanation;Demonstration;Verbal cues    Comprehension  Verbalized understanding;Returned demonstration       OT Short Term Goals - 01/30/20 1300      OT SHORT TERM GOAL #1   Title  Pt will be Mod I splinting use, care and precautions R dominant UE    Baseline  Requires Min VC's    Time  6    Period  Weeks    Status  Achieved    Target Date  02/12/20      OT SHORT TERM GOAL #2   Title  Pt will be Mod I home exercise program R UE    Baseline  Req Min VC's    Time  6    Period  Weeks    Status  Achieved    Target Date  02/12/20      OT SHORT TERM GOAL #3   Title  Pt will be Mod I edema control R UE    Baseline  Req Min VC's    Time  6    Period  Weeks    Status  Achieved    Target Date  02/12/20      OT SHORT TERM GOAL #4   Title  Pt will be Mod I scar management & desensitization PRN R volar wrist    Baseline  dependent    Time  6    Period  Weeks    Status  Achieved    Target Date  02/12/20        OT Long Term Goals - 02/19/20 1409      OT LONG TERM GOAL #1   Title  Pt will be Mod I upgraded splinting use, care and precautions R UE    Baseline  Dependent    Time  12    Period  Weeks    Status  Achieved      OT LONG TERM GOAL #2   Title  Pt will be Mod I upgraded HEP R UE/wrist    Baseline  dependent    Time  12    Period  Weeks    Status  Achieved      OT LONG TERM GOAL #3   Title  Pt will demonstrate 10# or greater grip strength right dominant UE as seen by JAMAR assessment in preparation for work tasks    Baseline  Unable/dependent    Time  12    Period  Weeks    Status  Achieved    Rt = 88 lbs, Lt = 128 lbs,      ( on 02/19/20-79.8)     OT LONG TERM GOAL #4   Title  Pt will rate pain as 3/10 or less during ADL's and simulated work tasks using R UE    Baseline  Unable/dependent    Time  12    Period  Weeks    Status  Partially Met   met for ADLS, just released to go back to work, has not returned to work           Plan - 02/19/20 1417    Clinical Impression Statement  Pt demonstrates good overall progress. He agrees with plans for d/c .    Occupational performance deficits (Please refer to evaluation for details):  ADL's;IADL's;Work    Body Structure / Function / Physical Skills  ADL;Dexterity;ROM;Edema;Scar mobility;Mobility;Flexibility;Strength;Coordination;FMC;Pain;UE functional use    Rehab Potential  Good    Comorbidities Affecting Occupational Performance:  None    OT Frequency  1x / week    OT Duration  12 weeks    OT Treatment/Interventions  Self-care/ADL training;Fluidtherapy;Splinting;Therapeutic activities;Therapeutic exercise;Scar mobilization;Passive range of motion;Paraffin;Patient/family education;Manual Therapy;Cryotherapy;Moist Heat    Consulted and Agree with Plan of Care  Patient       Patient will benefit from skilled therapeutic intervention in order to improve the following deficits and impairments:   Body Structure / Function / Physical Skills: ADL, Dexterity, ROM, Edema, Scar mobility, Mobility, Flexibility, Strength, Coordination, FMC, Pain, UE functional use       OCCUPATIONAL THERAPY DISCHARGE SUMMARY   Current functional level related to goals / functional outcomes:Pt demonstrates good progress towards goals. See goals.   Remaining deficits: Decreased ROM, decreased strength, decreased UE functional use,    Education / Equipment: Pt was educated regarding :HEP, splint wear,  care and precautions. Pt verbalizes understanding.  Plan: Patient agrees to discharge.  Patient goals were partially met. Patient is being  discharged due to the patient's request.  ?????         Visit Diagnosis: Stiffness of right elbow, not elsewhere classified  Stiffness of joint, forearm, right  Muscle weakness (generalized)  Pain in right wrist  Other lack of coordination  Localized edema    Problem List There are no problems to display for this patient.   Jodie Leiner 02/20/2020, 5:12 PM Theone Murdoch, OTR/L [phone] Phone: [phone] 5:12 PM 02/20/20 [facility] [address] [place], [place], [address] Phone: [phone]   Fax:  [phone]  Name: Kyle Zamora MRN: [id number] Date of Birth: 1961/02/25

## 2020-02-20 ENCOUNTER — Ambulatory Visit: Payer: No Typology Code available for payment source | Admitting: Occupational Therapy

## 2020-03-06 ENCOUNTER — Ambulatory Visit: Payer: No Typology Code available for payment source | Admitting: Occupational Therapy

## 2020-08-15 IMAGING — RF DG C-ARM 1-60 MIN
1 series · 2 of 2 positions shown · non-contrast
Comparison: Right forearm and wrist radiographs-12/09/2019

CLINICAL DATA: ORIF distal right radius

EXAM:
DG C-ARM 1-60 MIN; RIGHT FOREARM - 2 VIEW
FLUOROSCOPY TIME:  7 seconds

[Series 1: run · 2 of 2 slices shown]
[im 1/2]
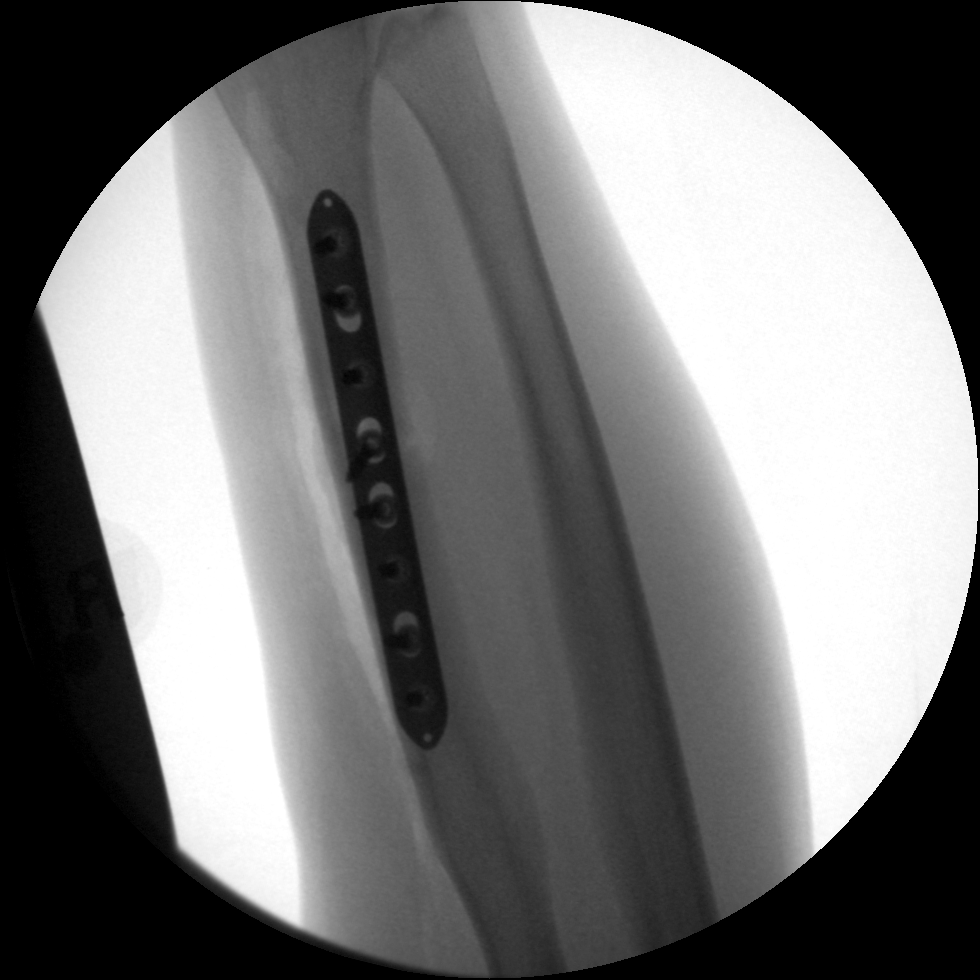
[im 2/2]
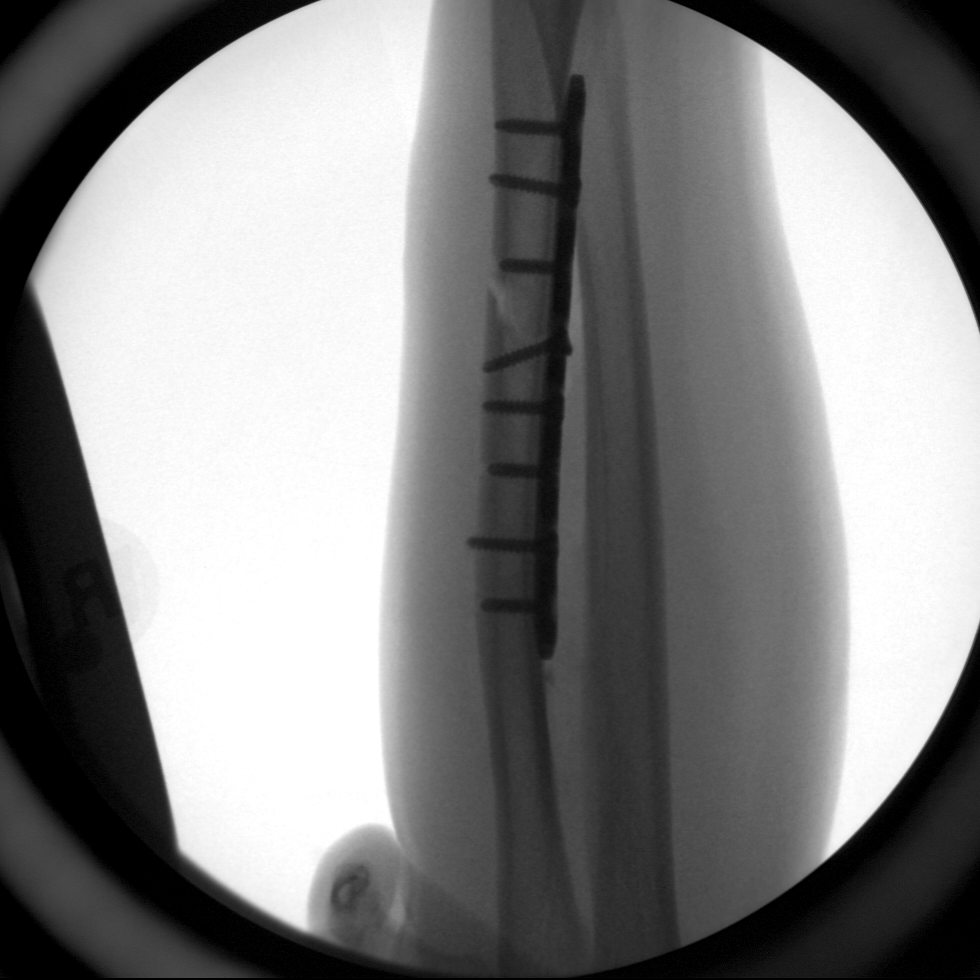

[2 of 2 positions shown; findings below may reference images not displayed]

FINDINGS: Two spot intraoperative fluoroscopic images of the mid/distal aspect
of the right radius and ulna are provided for review

Images demonstrate the sequela of sideplate fixation of known
comminuted obliquely oriented midshaft radial fracture with an
obliquely oriented cancellous screw at the main fracture site.
Alignment appears anatomic. There is a minimal amount of expected
adjacent subcutaneous emphysema. No radiopaque foreign body.
IMPRESSION: Post sideplate fixation of midshaft radial fracture without evidence
of complication.

## 2023-03-29 ENCOUNTER — Encounter: Payer: Self-pay | Admitting: Student

## 2023-03-29 ENCOUNTER — Ambulatory Visit (INDEPENDENT_AMBULATORY_CARE_PROVIDER_SITE_OTHER): Payer: Medicaid Other | Admitting: Student

## 2023-03-29 ENCOUNTER — Other Ambulatory Visit: Payer: Self-pay

## 2023-03-29 VITALS — BP 109/75 | HR 69 | Temp 98.4°F | Ht 71.0 in | Wt 178.0 lb

## 2023-03-29 DIAGNOSIS — Z720 Tobacco use: Secondary | ICD-10-CM

## 2023-03-29 DIAGNOSIS — Z0000 Encounter for general adult medical examination without abnormal findings: Secondary | ICD-10-CM

## 2023-03-29 DIAGNOSIS — Z1211 Encounter for screening for malignant neoplasm of colon: Secondary | ICD-10-CM

## 2023-03-29 DIAGNOSIS — F1090 Alcohol use, unspecified, uncomplicated: Secondary | ICD-10-CM | POA: Diagnosis not present

## 2023-03-29 DIAGNOSIS — F17210 Nicotine dependence, cigarettes, uncomplicated: Secondary | ICD-10-CM | POA: Diagnosis not present

## 2023-03-29 DIAGNOSIS — Z789 Other specified health status: Secondary | ICD-10-CM

## 2023-03-29 DIAGNOSIS — Z1159 Encounter for screening for other viral diseases: Secondary | ICD-10-CM

## 2023-03-29 DIAGNOSIS — N400 Enlarged prostate without lower urinary tract symptoms: Secondary | ICD-10-CM | POA: Diagnosis not present

## 2023-03-29 DIAGNOSIS — F5221 Male erectile disorder: Secondary | ICD-10-CM | POA: Diagnosis not present

## 2023-03-29 DIAGNOSIS — N521 Erectile dysfunction due to diseases classified elsewhere: Secondary | ICD-10-CM

## 2023-03-29 DIAGNOSIS — I10 Essential (primary) hypertension: Secondary | ICD-10-CM

## 2023-03-29 DIAGNOSIS — Z114 Encounter for screening for human immunodeficiency virus [HIV]: Secondary | ICD-10-CM

## 2023-03-29 MED ORDER — TAMSULOSIN HCL 0.4 MG PO CAPS: 0.4000 mg | ORAL_CAPSULE | Freq: Every day | ORAL | 11 refills | Status: DC

## 2023-03-29 MED ORDER — HYDROCHLOROTHIAZIDE 25 MG PO TABS: 25.0000 mg | ORAL_TABLET | Freq: Every day | ORAL | 11 refills | Status: DC

## 2023-03-29 MED ORDER — AMLODIPINE BESYLATE 5 MG PO TABS: 5.0000 mg | ORAL_TABLET | Freq: Every day | ORAL | 11 refills | Status: DC

## 2023-03-29 NOTE — Patient Instructions (Addendum)
It was a pleasure seeing you in clinic. Today we discussed:  Alcohol use Please reduce your alcohol intake to 2 or less beers in a day and not more than 4 drinks in a day Alcohol can cause depression in your central nervous system and worsen erectile dysfunction, cutting down may improve your symptoms with this  Hypertension Please continue amlodipine 5 mg daily, HCTZ 25 mg daily if you develop dizziness or  lightheadedness or your blood pressures are consistently low please give [place] a call we may need to go down on your blood pressure medications  We will check screening labs for diabetes, cholesterol, complete metabolic panel, complete blood count today.  We will also screen you for HIV and hepatitis C.  I have made a referral to gastroenterology for colonoscopy to screen for colon cancer, it is okay to take Metamucil to increase her fiber intake  Please follow-up in 3 months  If you have questions or concerns before your next appointment please call our clinic at [phone]

## 2023-03-29 NOTE — Progress Notes (Unsigned)
New Patient Office Visit  Subjective    Patient ID: Kyle Zamora, male    DOB: 06/01/1961  Age: 62 y.o. MRN: [id number]  CC:  Chief Complaint  Patient presents with   Follow-up    PATIENT IS NEW TO CLINIC TO BE ESTABLISHED/ ? MEDICATION REFILL    HPI Kyle Zamora presents to establish care ***   Amlodipine 5mg  daily Hctz Tamsulosin 0.4  Hctz25 mg  Bph Straining and ushing   No diabetes    Outpatient Encounter Medications as of 03/29/2023  Medication Sig   acetaminophen (TYLENOL) 325 MG tablet Take 2 tablets (650 mg total) by mouth every 6 (six) hours.   amLODipine (NORVASC) 5 MG tablet Take 5 mg by mouth daily.   cyclobenzaprine (FLEXERIL) 10 MG tablet Take 10 mg by mouth 3 (three) times daily as needed for muscle spasms.   hydrochlorothiazide (HYDRODIURIL) 25 MG tablet Take 25 mg by mouth daily.   ibuprofen (ADVIL) 200 MG tablet Take 3 tablets (600 mg total) by mouth every 6 (six) hours.   oxyCODONE (ROXICODONE) 5 MG immediate release tablet Take 1 tablet (5 mg total) by mouth every 6 (six) hours as needed for severe pain.   tamsulosin (FLOMAX) 0.4 MG CAPS capsule Take 0.4 mg by mouth daily.   No facility-administered encounter medications on file as of 03/29/2023.    Past Medical History:  Diagnosis Date   Hypertension     Past Surgical History:  Procedure Laterality Date   OPEN REDUCTION INTERNAL FIXATION (ORIF) DISTAL RADIAL FRACTURE Right 12/20/2019   Procedure: OPEN REDUCTION INTERNAL FIXATION (ORIF) RIGHT RADIUS SHAFT;  Surgeon: Mack Hook, MD;  Location: [facility];  Service: Orthopedics;  Laterality: Right;  PRE-OP BLOCK   OTHER SURGICAL HISTORY     "skull surgery as an infant"    No family history on file.  Social History   Socioeconomic History   Marital status: Single    Spouse name: Not on file   Number of children: Not on file   Years of education: Not on file   Highest education level: Not on file  Occupational  History   Not on file  Tobacco Use   Smoking status: Every Day    Types: Cigarettes   Smokeless tobacco: Never  Vaping Use   Vaping Use: Never used  Substance and Sexual Activity   Alcohol use: Yes    Alcohol/week: 12.0 standard drinks of alcohol    Types: 12 Standard drinks or equivalent per week   Drug use: No   Sexual activity: Not on file  Other Topics Concern   Not on file  Social History Narrative   Not on file   Social Determinants of Health   Financial Resource Strain: Not on file  Food Insecurity: Not on file  Transportation Needs: Not on file  Physical Activity: Not on file  Stress: Not on file  Social Connections: Not on file  Intimate Partner Violence: Not on file    ROS      Objective    BP 109/75 (BP Location: Right Arm, Patient Position: Sitting, Cuff Size: Normal)   Pulse 69   Temp 98.4 F (36.9 C) (Oral)   Ht 5\' 11"  (1.803 m)   Wt 178 lb (80.7 kg)   SpO2 99%   BMI 24.83 kg/m   Physical Exam  {Labs (Optional):23779}    Assessment & Plan:   Problem List Items Addressed This Visit   None   No follow-ups  on file.   Quincy Simmonds, MD

## 2023-03-30 ENCOUNTER — Other Ambulatory Visit: Payer: Self-pay | Admitting: Student

## 2023-03-30 ENCOUNTER — Encounter: Payer: Self-pay | Admitting: Student

## 2023-03-30 DIAGNOSIS — I10 Essential (primary) hypertension: Secondary | ICD-10-CM | POA: Insufficient documentation

## 2023-03-30 DIAGNOSIS — F5221 Male erectile disorder: Secondary | ICD-10-CM | POA: Insufficient documentation

## 2023-03-30 DIAGNOSIS — Z0000 Encounter for general adult medical examination without abnormal findings: Secondary | ICD-10-CM | POA: Insufficient documentation

## 2023-03-30 DIAGNOSIS — Z720 Tobacco use: Secondary | ICD-10-CM | POA: Insufficient documentation

## 2023-03-30 DIAGNOSIS — Z789 Other specified health status: Secondary | ICD-10-CM | POA: Insufficient documentation

## 2023-03-30 DIAGNOSIS — N400 Enlarged prostate without lower urinary tract symptoms: Secondary | ICD-10-CM | POA: Insufficient documentation

## 2023-03-30 DIAGNOSIS — Z1211 Encounter for screening for malignant neoplasm of colon: Secondary | ICD-10-CM | POA: Insufficient documentation

## 2023-03-30 LAB — CMP14 + ANION GAP
ALT: 20 IU/L (ref 0–44)
AST: 19 IU/L (ref 0–40)
Albumin/Globulin Ratio: 1.7 (ref 1.2–2.2)
Albumin: 3.8 g/dL — ABNORMAL LOW (ref 3.9–4.9)
Alkaline Phosphatase: 87 IU/L (ref 44–121)
Anion Gap: 14 mmol/L (ref 10.0–18.0)
BUN/Creatinine Ratio: 14 (ref 10–24)
BUN: 10 mg/dL (ref 8–27)
Bilirubin Total: 0.3 mg/dL (ref 0.0–1.2)
CO2: 26 mmol/L (ref 20–29)
Calcium: 9.4 mg/dL (ref 8.6–10.2)
Chloride: 101 mmol/L (ref 96–106)
Creatinine, Ser: 0.74 mg/dL — ABNORMAL LOW (ref 0.76–1.27)
Globulin, Total: 2.3 g/dL (ref 1.5–4.5)
Glucose: 101 mg/dL — ABNORMAL HIGH (ref 70–99)
Potassium: 4.5 mmol/L (ref 3.5–5.2)
Sodium: 141 mmol/L (ref 134–144)
Total Protein: 6.1 g/dL (ref 6.0–8.5)
eGFR: 103 mL/min/{1.73_m2} (ref >59)

## 2023-03-30 LAB — LIPID PANEL
Chol/HDL Ratio: 3.5 ratio (ref 0.0–5.0)
Cholesterol, Total: 176 mg/dL (ref 100–199)
HDL: 50 mg/dL (ref >39)
LDL Chol Calc (NIH): 104 mg/dL — ABNORMAL HIGH (ref 0–99)
Triglycerides: 121 mg/dL (ref 0–149)
VLDL Cholesterol Cal: 22 mg/dL (ref 5–40)

## 2023-03-30 LAB — CBC
Hematocrit: 42.4 % (ref 37.5–51.0)
Hemoglobin: 14 g/dL (ref 13.0–17.7)
MCH: 31.3 pg (ref 26.6–33.0)
MCHC: 33 g/dL (ref 31.5–35.7)
MCV: 95 fL (ref 79–97)
Platelets: 222 10*3/uL (ref 150–450)
RBC: 4.48 x10E6/uL (ref 4.14–5.80)
RDW: 12.8 % (ref 11.6–15.4)
WBC: 7.1 10*3/uL (ref 3.4–10.8)

## 2023-03-30 LAB — HEMOGLOBIN A1C
Est. average glucose Bld gHb Est-mCnc: 117 mg/dL
Hgb A1c MFr Bld: 5.7 % — ABNORMAL HIGH (ref 4.8–5.6)

## 2023-03-30 LAB — HIV ANTIBODY (ROUTINE TESTING W REFLEX): HIV Screen 4th Generation wRfx: NONREACTIVE

## 2023-03-30 LAB — HCV AB W REFLEX TO QUANT PCR: HCV Ab: NONREACTIVE

## 2023-03-30 LAB — HCV INTERPRETATION

## 2023-03-30 MED ORDER — ATORVASTATIN CALCIUM 20 MG PO TABS: 20.0000 mg | ORAL_TABLET | Freq: Every day | ORAL | 11 refills | Status: DC

## 2023-03-30 NOTE — Assessment & Plan Note (Signed)
Ongoing issue for the last several years with maintaining erections.  Does have morning erections.  Endorses difficulty reaching orgasm.  Symptoms more prominent when he is with partners.  Denies any depression or anxiety symptoms.  Do think his alcohol use is contributing to his symptoms.  Advised limiting his drinking.  If not effective can consider CBT.

## 2023-03-30 NOTE — Assessment & Plan Note (Addendum)
Drinking about 2-3 12 oz beers 2-3 times on weekdays and 12 beers Friday-Sunday.   No withdrawals when he is not drinking.  Discussed limiting drinking to less than 2 beers a day and no more than 3 drinks at a time. Alcohol abuse may be contributing to his symptoms the ED.  Patient will consider drinking less.   Check CBC, CMP

## 2023-03-30 NOTE — Assessment & Plan Note (Addendum)
BP 109/75. Report BP around 120/70 at plasma donation lately.  No symptoms of dizziness or lower pressures in office today.  Will continue to monitor blood pressure if consistently low or he develops symptoms may need to titrate down his antihypertensives.  Continue amlodipine 5 mg and HCTZ 25 mg

## 2023-03-30 NOTE — Assessment & Plan Note (Addendum)
Screening for cholesterol and diabetes with lipid panel and A1c given history of hypertension and significant alcohol use  Screening HIV and hepatitis C

## 2023-03-30 NOTE — Assessment & Plan Note (Signed)
No prior testing for this in the past.  Colonoscopy ordered today

## 2023-04-04 NOTE — Progress Notes (Signed)
Internal Medicine Clinic Attending ? ?Case discussed with Dr. Liang  At the time of the visit.  We reviewed the resident?s history and exam and pertinent patient test results.  I agree with the assessment, diagnosis, and plan of care documented in the resident?s note. ? ?

## 2023-04-10 ENCOUNTER — Ambulatory Visit: Payer: Medicaid Other

## 2023-04-10 ENCOUNTER — Other Ambulatory Visit: Payer: Self-pay

## 2023-04-10 VITALS — BP 119/68 | HR 68 | Temp 98.0°F | Ht 71.0 in | Wt 177.0 lb

## 2023-04-10 DIAGNOSIS — M25561 Pain in right knee: Secondary | ICD-10-CM | POA: Diagnosis present

## 2023-04-10 DIAGNOSIS — M25569 Pain in unspecified knee: Secondary | ICD-10-CM | POA: Insufficient documentation

## 2023-04-10 NOTE — Progress Notes (Signed)
   CC: knee pain  HPI: [name] is a 63 y.o. with past medical history as below who presents for handicap placard.  Please see detailed assessment plan for HPI.  Past Medical History:  Diagnosis Date   Hypertension    Review of Systems: Please see detailed assessment and plan for ROS.  Physical Exam:  Vitals:   04/10/23 1029  BP: 119/68  Pulse: 68  Temp: 98 F (36.7 C)  TempSrc: Oral  SpO2: 100%  Weight: 177 lb (80.3 kg)  Height: 5\' 11"  (1.803 m)   Physical Exam HENT:     Head: Normocephalic and atraumatic.  Eyes:     Extraocular Movements: Extraocular movements intact.  Pulmonary:     Effort: Pulmonary effort is normal.  Musculoskeletal:     Cervical back: Neck supple.     Right lower leg: No edema.     Left lower leg: No edema.     Comments: Right knee without warmth, erythema, or effusion.  Medial and lateral joint line tenderness.  Negative McMurray's, negative for pain on varus or valgus stress testing, negative for anterior or posterior joint laxity.  No crepitus.  Skin:    General: Skin is warm and dry.  Neurological:     Mental Status: He is alert and oriented to person, place, and time.  Psychiatric:        Mood and Affect: Mood normal.        Behavior: Behavior normal.      Assessment & Plan:   See Encounters Tab for problem based charting.  Knee pain Patient presents with several months of right knee pain.  Denies any precipitating injury.  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  He reports that the pain is most prominent when he is walking.  He requests handicap placard.  He has not taken any medicine for this.  Exam is nonrevealing, only with some medial and lateral joint tenderness to palpation.  Suspect osteoarthritis.  No signs of infection or inflammatory process. -Right knee x-ray -Referral for physical therapy -Trial ibuprofen for 1 week -Patient is already scheduled to return in 2  months.  Can follow-up at that time.  Patient discussed with Dr. Antony Contras

## 2023-04-10 NOTE — Assessment & Plan Note (Signed)
Patient presents with several months of right knee pain.  Denies any precipitating injury.  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  He reports that the pain is most prominent when he is walking.  He requests handicap placard.  He has not taken any medicine for this.  Exam is nonrevealing, only with some medial and lateral joint tenderness to palpation.  Suspect osteoarthritis.  No signs of infection or inflammatory process. -Right knee x-ray -Referral for physical therapy -Trial ibuprofen for 1 week -Patient is already scheduled to return in 2 months.  Can follow-up at that time.

## 2023-04-10 NOTE — Patient Instructions (Signed)
Kyle Zamora, it was a pleasure seeing you today!  Today we discussed: Knee pain - Take ibuprofen 800 mg 3 times daily for 7 days. Will refer for physical therapy. Will get knee xray.  I have ordered the following labs today:  Lab Orders  No laboratory test(s) ordered today     Tests ordered today:  Rt knee xray  Referrals ordered today:   Referral Orders  No referral(s) requested today     I have ordered the following medication/changed the following medications:   Stop the following medications: There are no discontinued medications.   Start the following medications: No orders of the defined types were placed in this encounter.    Follow-up: 6 months   Please make sure to arrive 15 minutes prior to your next appointment. If you arrive late, you may be asked to reschedule.   We look forward to seeing you next time. Please call our clinic at [phone] if you have any questions or concerns. The best time to call is Monday-Friday from 9am-4pm, but there is someone available 24/7. If after hours or the weekend, call the main hospital number and ask for the Internal Medicine Resident On-Call. If you need medication refills, please notify your pharmacy one week in advance and they will send [place] a request.  Thank you for letting us take part in your care. Wishing you the best!  Thank you, Adron Bene, MD

## 2023-04-11 NOTE — Progress Notes (Signed)
Internal Medicine Clinic Attending  Case discussed with Dr. White  At the time of the visit.  We reviewed the resident's history and exam and pertinent patient test results.  I agree with the assessment, diagnosis, and plan of care documented in the resident's note.  

## 2023-05-10 ENCOUNTER — Encounter: Payer: Self-pay | Admitting: Internal Medicine

## 2023-05-22 ENCOUNTER — Encounter: Payer: Self-pay | Admitting: Student

## 2023-07-04 ENCOUNTER — Encounter: Payer: Self-pay | Admitting: Student

## 2023-07-04 ENCOUNTER — Other Ambulatory Visit: Payer: Self-pay

## 2023-07-04 ENCOUNTER — Ambulatory Visit (INDEPENDENT_AMBULATORY_CARE_PROVIDER_SITE_OTHER): Payer: Medicaid Other | Admitting: Student

## 2023-07-04 VITALS — BP 115/65 | HR 64 | Temp 97.8°F | Ht 71.0 in | Wt 179.8 lb

## 2023-07-04 DIAGNOSIS — M79601 Pain in right arm: Secondary | ICD-10-CM | POA: Diagnosis present

## 2023-07-04 DIAGNOSIS — M79603 Pain in arm, unspecified: Secondary | ICD-10-CM | POA: Insufficient documentation

## 2023-07-04 MED ORDER — DICLOFENAC SODIUM 1 % EX GEL: 2.0000 g | Freq: Four times a day (QID) | CUTANEOUS | 0 refills | Status: DC

## 2023-07-04 MED ORDER — ATORVASTATIN CALCIUM 20 MG PO TABS: 20.0000 mg | ORAL_TABLET | Freq: Every day | ORAL | 3 refills | Status: DC

## 2023-07-04 NOTE — Progress Notes (Signed)
CC: Arm pain  HPI:  [name] is a 62 y.o. male living with a history stated below and presents today for right sided arm pain. Please see problem based assessment and plan for additional details.  Past Medical History:  Diagnosis Date   Hypertension     Current Outpatient Medications on File Prior to Visit  Medication Sig Dispense Refill   amLODipine (NORVASC) 5 MG tablet Take 1 tablet (5 mg total) by mouth daily. 30 tablet 11   hydrochlorothiazide (HYDRODIURIL) 25 MG tablet Take 1 tablet (25 mg total) by mouth daily. 30 tablet 11   tamsulosin (FLOMAX) 0.4 MG CAPS capsule Take 1 capsule (0.4 mg total) by mouth daily. 30 capsule 11   No current facility-administered medications on file prior to visit.    Family History  Problem Relation Age of Onset   Cancer Mother    Heart failure Father    Hypertension Father    Cancer Maternal Aunt        pancreatic cancer   Cancer Other     Social History   Socioeconomic History   Marital status: Single    Spouse name: Not on file   Number of children: Not on file   Years of education: Not on file   Highest education level: Not on file  Occupational History   Not on file  Tobacco Use   Smoking status: Every Day    Types: Cigarettes   Smokeless tobacco: Never   Tobacco comments:    Smokes when he drinks alcohol, 45 years   Vaping Use   Vaping status: Never Used  Substance and Sexual Activity   Alcohol use: Yes    Alcohol/week: 12.0 standard drinks of alcohol    Types: 12 Standard drinks or equivalent per week    Comment: 2-3 12 ox beers once a week and 12 pack on weekends   Drug use: No   Sexual activity: Not Currently    Partners: Female  Other Topics Concern   Not on file  Social History Narrative   Lives in [place] with mother, was a Statistician, currently looking for a job   Social Determinants of Corporate investment banker Strain: Not on file  Food Insecurity: Not on file  Transportation Needs:  Not on file  Physical Activity: Not on file  Stress: Not on file  Social Connections: Not on file  Intimate Partner Violence: Not on file    Review of Systems: ROS negative except for what is noted on the assessment and plan.  Vitals:   07/04/23 0848  BP: 115/65  Pulse: 64  Temp: 97.8 F (36.6 C)  TempSrc: Oral  SpO2: 100%  Weight: 179 lb 12.8 oz (81.6 kg)  Height: 5\' 11"  (1.803 m)    Physical Exam: Constitutional: well-appearing male in no acute distress Cardiovascular: regular rate and rhythm, no m/r/g Pulmonary/Chest: normal work of breathing on room air, lungs clear to auscultation bilaterally Abdominal: soft, non-tender, non-distended MSK: Full range of motion of right upper extremity at the elbow and also wrist, radial pulses intact.  No erythema present Neurological: alert & oriented x 3, 5/5 strength in bilateral upper and lower extremities, normal gait Skin: warm and dry  Assessment & Plan:   Arm pain Patient presents with a 3-day history of right arm pain.  The pain is in the lateral aspect of the bicep, and he describes as a dull aching pain.  He states that he lifts about 20 pounds  and is currently in the process of helping his mom move.  He has full range of motion, and pain gets better on palpation.  There is no erythema of the area, and is radial pulses intact +2.  This pain seems consistent with a muscular strain, will treat conservatively with Voltaren gel.  Informed patient to let us know if pain does not cease with Voltaren gel, can consider NSAID or muscle relaxer therapy for short course.  Plan: - Voltaren gel  Patient discussed with Dr. Kae Heller Rahmon Heigl, M.D. [facility] Internal Medicine, PGY-2 Pager: [phone] Date 07/04/2023 Time 11:50 AM

## 2023-07-04 NOTE — Patient Instructions (Signed)
Thank you so much for coming to the clinic today!   For your arm pain I am going to be sending in some gel that you can rub on about 4 times a day if needed.  If does not help please let us know and we can send in some oral anti-inflammatory medication.  It is important to use some ice and left arm rest is much as you can.  I am also sending in the cholesterol medication as well.  If you have any questions please feel free to the call the clinic at anytime at [phone]. It was a pleasure seeing you!  Best, Dr. Thomasene Ripple

## 2023-07-04 NOTE — Assessment & Plan Note (Signed)
Patient presents with a 3-day history of right arm pain.  The pain is in the lateral aspect of the bicep, and he describes as a dull aching pain.  He states that he lifts about 20 pounds and is currently in the process of helping his mom move.  He has full range of motion, and pain gets better on palpation.  There is no erythema of the area, and is radial pulses intact +2.  This pain seems consistent with a muscular strain, will treat conservatively with Voltaren gel.  Informed patient to let us know if pain does not cease with Voltaren gel, can consider NSAID or muscle relaxer therapy for short course.  Plan: - Voltaren gel

## 2023-07-24 NOTE — Progress Notes (Signed)
Internal Medicine Clinic Attending  I was physically present during the key portions of the resident provided service and participated in the medical decision making of patient's management care. I reviewed pertinent patient test results.  The assessment, diagnosis, and plan were formulated together and I agree with the documentation in the resident's note.  Reymundo Poll, MD

## 2023-10-16 ENCOUNTER — Ambulatory Visit (HOSPITAL_COMMUNITY)
Admission: RE | Admit: 2023-10-16 | Discharge: 2023-10-16 | Disposition: A | Discharge status: Home / Self Care | Payer: No Typology Code available for payment source | Source: Ambulatory Visit | Attending: Internal Medicine | Admitting: Internal Medicine

## 2023-10-16 ENCOUNTER — Encounter: Payer: Self-pay | Admitting: Student

## 2023-10-16 ENCOUNTER — Ambulatory Visit (INDEPENDENT_AMBULATORY_CARE_PROVIDER_SITE_OTHER): Payer: Medicaid Other | Admitting: Student

## 2023-10-16 DIAGNOSIS — E538 Deficiency of other specified B group vitamins: Secondary | ICD-10-CM

## 2023-10-16 DIAGNOSIS — M25561 Pain in right knee: Secondary | ICD-10-CM | POA: Diagnosis not present

## 2023-10-16 DIAGNOSIS — T148XXA Other injury of unspecified body region, initial encounter: Secondary | ICD-10-CM

## 2023-10-16 DIAGNOSIS — E785 Hyperlipidemia, unspecified: Secondary | ICD-10-CM

## 2023-10-16 DIAGNOSIS — G8929 Other chronic pain: Secondary | ICD-10-CM

## 2023-10-16 DIAGNOSIS — F1090 Alcohol use, unspecified, uncomplicated: Secondary | ICD-10-CM | POA: Diagnosis not present

## 2023-10-16 DIAGNOSIS — Z872 Personal history of diseases of the skin and subcutaneous tissue: Secondary | ICD-10-CM | POA: Diagnosis not present

## 2023-10-16 DIAGNOSIS — F17210 Nicotine dependence, cigarettes, uncomplicated: Secondary | ICD-10-CM

## 2023-10-16 DIAGNOSIS — Z789 Other specified health status: Secondary | ICD-10-CM

## 2023-10-16 DIAGNOSIS — I10 Essential (primary) hypertension: Secondary | ICD-10-CM | POA: Diagnosis not present

## 2023-10-16 DIAGNOSIS — R200 Anesthesia of skin: Secondary | ICD-10-CM

## 2023-10-16 DIAGNOSIS — M79601 Pain in right arm: Secondary | ICD-10-CM

## 2023-10-16 DIAGNOSIS — Z1211 Encounter for screening for malignant neoplasm of colon: Secondary | ICD-10-CM

## 2023-10-16 DIAGNOSIS — Z720 Tobacco use: Secondary | ICD-10-CM

## 2023-10-16 DIAGNOSIS — Z0000 Encounter for general adult medical examination without abnormal findings: Secondary | ICD-10-CM

## 2023-10-16 NOTE — Patient Instructions (Addendum)
Expect a call to set up your colonoscopy and dermatology appointment and Xray. I will check labs for your numbness and pain. I will call with results. Please return in 3 months or sooner if needed.  Try to stop smoking. When you return, we can discuss medicine to help if necessary.  Stopping smoking and reducing alcohol will reduce your risk of stroke.

## 2023-10-17 DIAGNOSIS — E785 Hyperlipidemia, unspecified: Secondary | ICD-10-CM | POA: Insufficient documentation

## 2023-10-17 DIAGNOSIS — Z872 Personal history of diseases of the skin and subcutaneous tissue: Secondary | ICD-10-CM | POA: Insufficient documentation

## 2023-10-17 MED ORDER — HYDROCHLOROTHIAZIDE 25 MG PO TABS: 25.0000 mg | ORAL_TABLET | Freq: Every day | ORAL | 11 refills | Status: DC

## 2023-10-17 MED ORDER — AMLODIPINE BESYLATE 5 MG PO TABS: 5.0000 mg | ORAL_TABLET | Freq: Every day | ORAL | 11 refills | Status: DC

## 2023-10-17 MED ORDER — ATORVASTATIN CALCIUM 20 MG PO TABS: 20.0000 mg | ORAL_TABLET | Freq: Every day | ORAL | 3 refills | Status: DC

## 2023-10-17 NOTE — Assessment & Plan Note (Signed)
He drinks 2-3 or more beers daily.  He may be above 21 drinks per week, in line with criteria for alcohol use disorder.  Reportedly drink a lot more in the past, has cut back recently.  I wonder if alcohol neuropathy may contribute to the above complaints of the arm and leg.  I talked about alcohol cessation with him, he does not want to try medicine at this time.  He will cut back.  Can revisit this at future visits and consider medicine.  I will check labs in light of his high alcohol use and sensory/MSK complaints - BMP, magnesium, B1, B12

## 2023-10-17 NOTE — Assessment & Plan Note (Signed)
Repeat lipid panel - Continue atorvastatin 20 daily.

## 2023-10-17 NOTE — Assessment & Plan Note (Signed)
Blood pressure at goal.  115/65 - Continue amlodipine 5 mg and hydrochlorothiazide 25 mg

## 2023-10-17 NOTE — Assessment & Plan Note (Signed)
He presents with concern of pain and new paresthesia of the right arm.  To extent the right arm has been evaluated in this clinic in the past, in setting of physical work has been treated conservatively with Voltaren gel.  He is seeking reevaluation because he now has paresthesia, described as numbness, reduced sensation, of the anterior aspect of the forearm.  He says that his chronic pain is actually somewhat better.  Exam is grossly unremarkable, he is able to localize sensation, he is neurovascularly intact, strength and range of motion is full, no signs for acute debility, fever, stroke, etc.  He did have a workplace injury when he broke one of the bones of his arm that required fixation with a metal plate.  This was a couple of years ago.  It is not bothering him enough to seek additional medicine.  I expect his paresthesias secondary to his previous accident, surgical repair.  Will monitor.

## 2023-10-17 NOTE — Assessment & Plan Note (Addendum)
He is a long history of right knee pain, likely arthritis, he has worked physical jobs all his life.  He reports new paresthesia of the right posterior calf.  Pain still is present.  Not having trouble walking, range of motion, sensory localization, strength are all well intact.  In the past a right knee x-ray was ordered, but not completed, I will reorder this. -X-ray R knee
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
## 2023-10-17 NOTE — Assessment & Plan Note (Signed)
In light of his above complaints of right-sided numbness he was concerned that he had an impending stroke.  His exam was reassuring that no stroke had happened, but we did talk about ways to improve his vascular health.  He currently smokes about 1 pack of cigarettes weekly.  He will attempt to quit, would like to trial cold [place] first.  Can consider medicines at future visits

## 2023-10-18 NOTE — Progress Notes (Signed)
Internal Medicine Clinic Attending  I was physically present during the key portions of the resident provided service and participated in the medical decision making of patient's management care. I reviewed pertinent patient test results.  The assessment, diagnosis, and plan were formulated together and I agree with the documentation in the resident's note.  Focal paresthesia over posterior calf of unclear etiology. Possible compression from foreign body vs other, XR of R knee given history of pain.   Dickie La, MD

## 2023-10-19 LAB — BMP8+ANION GAP
Anion Gap: 13 mmol/L (ref 10.0–18.0)
BUN/Creatinine Ratio: 11 (ref 10–24)
BUN: 8 mg/dL (ref 8–27)
CO2: 25 mmol/L (ref 20–29)
Calcium: 9.6 mg/dL (ref 8.6–10.2)
Chloride: 103 mmol/L (ref 96–106)
Creatinine, Ser: 0.7 mg/dL — ABNORMAL LOW (ref 0.76–1.27)
Glucose: 94 mg/dL (ref 70–99)
Potassium: 4.1 mmol/L (ref 3.5–5.2)
Sodium: 141 mmol/L (ref 134–144)
eGFR: 104 mL/min/{1.73_m2} (ref >59)

## 2023-10-19 LAB — LIPID PANEL
Chol/HDL Ratio: 3.1 {ratio} (ref 0.0–5.0)
Cholesterol, Total: 148 mg/dL (ref 100–199)
HDL: 48 mg/dL (ref >39)
LDL Chol Calc (NIH): 77 mg/dL (ref 0–99)
Triglycerides: 132 mg/dL (ref 0–149)
VLDL Cholesterol Cal: 23 mg/dL (ref 5–40)

## 2023-10-19 LAB — VITAMIN B1: Thiamine: 134.4 nmol/L (ref 66.5–200.0)

## 2023-10-19 LAB — MAGNESIUM: Magnesium: 2.3 mg/dL (ref 1.6–2.3)

## 2023-10-19 LAB — VITAMIN B12: Vitamin B-12: 553 pg/mL (ref 232–1245)

## 2023-12-01 ENCOUNTER — Emergency Department (HOSPITAL_COMMUNITY): Payer: Medicaid Other

## 2023-12-01 ENCOUNTER — Encounter (HOSPITAL_COMMUNITY): Payer: Self-pay

## 2023-12-01 ENCOUNTER — Other Ambulatory Visit: Payer: Self-pay

## 2023-12-01 ENCOUNTER — Inpatient Hospital Stay (HOSPITAL_COMMUNITY)
Admission: EM | Admit: 2023-12-01 | Discharge: 2023-12-03 | DRG: 330 | Disposition: A | Discharge status: Home / Self Care | Payer: Medicaid Other | Attending: Internal Medicine | Admitting: Internal Medicine

## 2023-12-01 DIAGNOSIS — K222 Esophageal obstruction: Secondary | ICD-10-CM | POA: Diagnosis present

## 2023-12-01 DIAGNOSIS — K3189 Other diseases of stomach and duodenum: Secondary | ICD-10-CM | POA: Diagnosis present

## 2023-12-01 DIAGNOSIS — K625 Hemorrhage of anus and rectum: Principal | ICD-10-CM

## 2023-12-01 DIAGNOSIS — Z79899 Other long term (current) drug therapy: Secondary | ICD-10-CM | POA: Diagnosis not present

## 2023-12-01 DIAGNOSIS — Z5986 Financial insecurity: Secondary | ICD-10-CM

## 2023-12-01 DIAGNOSIS — F1410 Cocaine abuse, uncomplicated: Secondary | ICD-10-CM | POA: Diagnosis present

## 2023-12-01 DIAGNOSIS — Z8249 Family history of ischemic heart disease and other diseases of the circulatory system: Secondary | ICD-10-CM

## 2023-12-01 DIAGNOSIS — I959 Hypotension, unspecified: Secondary | ICD-10-CM | POA: Diagnosis present

## 2023-12-01 DIAGNOSIS — E872 Acidosis, unspecified: Secondary | ICD-10-CM | POA: Diagnosis present

## 2023-12-01 DIAGNOSIS — I10 Essential (primary) hypertension: Secondary | ICD-10-CM | POA: Diagnosis present

## 2023-12-01 DIAGNOSIS — B9681 Helicobacter pylori [H. pylori] as the cause of diseases classified elsewhere: Secondary | ICD-10-CM | POA: Diagnosis not present

## 2023-12-01 DIAGNOSIS — D123 Benign neoplasm of transverse colon: Secondary | ICD-10-CM | POA: Diagnosis present

## 2023-12-01 DIAGNOSIS — D125 Benign neoplasm of sigmoid colon: Secondary | ICD-10-CM | POA: Diagnosis present

## 2023-12-01 DIAGNOSIS — K2980 Duodenitis without bleeding: Secondary | ICD-10-CM

## 2023-12-01 DIAGNOSIS — K315 Obstruction of duodenum: Secondary | ICD-10-CM

## 2023-12-01 DIAGNOSIS — R55 Syncope and collapse: Secondary | ICD-10-CM | POA: Diagnosis present

## 2023-12-01 DIAGNOSIS — E785 Hyperlipidemia, unspecified: Secondary | ICD-10-CM | POA: Diagnosis present

## 2023-12-01 DIAGNOSIS — D62 Acute posthemorrhagic anemia: Secondary | ICD-10-CM | POA: Diagnosis present

## 2023-12-01 DIAGNOSIS — Z91148 Patient's other noncompliance with medication regimen for other reason: Secondary | ICD-10-CM

## 2023-12-01 DIAGNOSIS — K922 Gastrointestinal hemorrhage, unspecified: Secondary | ICD-10-CM | POA: Diagnosis not present

## 2023-12-01 DIAGNOSIS — N400 Enlarged prostate without lower urinary tract symptoms: Secondary | ICD-10-CM | POA: Diagnosis present

## 2023-12-01 DIAGNOSIS — K640 First degree hemorrhoids: Secondary | ICD-10-CM

## 2023-12-01 DIAGNOSIS — K6289 Other specified diseases of anus and rectum: Secondary | ICD-10-CM | POA: Diagnosis not present

## 2023-12-01 DIAGNOSIS — Z800 Family history of malignant neoplasm of digestive organs: Secondary | ICD-10-CM | POA: Diagnosis not present

## 2023-12-01 DIAGNOSIS — K2970 Gastritis, unspecified, without bleeding: Secondary | ICD-10-CM | POA: Diagnosis present

## 2023-12-01 DIAGNOSIS — E876 Hypokalemia: Secondary | ICD-10-CM | POA: Diagnosis present

## 2023-12-01 DIAGNOSIS — K2950 Unspecified chronic gastritis without bleeding: Secondary | ICD-10-CM | POA: Diagnosis not present

## 2023-12-01 DIAGNOSIS — D649 Anemia, unspecified: Secondary | ICD-10-CM

## 2023-12-01 DIAGNOSIS — F1010 Alcohol abuse, uncomplicated: Secondary | ICD-10-CM | POA: Diagnosis present

## 2023-12-01 DIAGNOSIS — K921 Melena: Secondary | ICD-10-CM | POA: Diagnosis present

## 2023-12-01 DIAGNOSIS — F17210 Nicotine dependence, cigarettes, uncomplicated: Secondary | ICD-10-CM | POA: Diagnosis present

## 2023-12-01 DIAGNOSIS — K648 Other hemorrhoids: Secondary | ICD-10-CM | POA: Diagnosis not present

## 2023-12-01 DIAGNOSIS — K5730 Diverticulosis of large intestine without perforation or abscess without bleeding: Secondary | ICD-10-CM | POA: Diagnosis not present

## 2023-12-01 DIAGNOSIS — K633 Ulcer of intestine: Secondary | ICD-10-CM | POA: Diagnosis not present

## 2023-12-01 HISTORY — DX: Gastrointestinal hemorrhage, unspecified: K92.2

## 2023-12-01 HISTORY — DX: Benign prostatic hyperplasia without lower urinary tract symptoms: N40.0

## 2023-12-01 LAB — COMPREHENSIVE METABOLIC PANEL
ALT: 15 U/L (ref 0–44)
AST: 17 U/L (ref 15–41)
Albumin: 3.1 g/dL — ABNORMAL LOW (ref 3.5–5.0)
Alkaline Phosphatase: 60 U/L (ref 38–126)
Anion gap: 12 (ref 5–15)
BUN: 7 mg/dL — ABNORMAL LOW (ref 8–23)
CO2: 22 mmol/L (ref 22–32)
Calcium: 8.5 mg/dL — ABNORMAL LOW (ref 8.9–10.3)
Chloride: 106 mmol/L (ref 98–111)
Creatinine, Ser: 0.74 mg/dL (ref 0.61–1.24)
GFR, Estimated: >60 mL/min (ref >60)
Glucose, Bld: 105 mg/dL — ABNORMAL HIGH (ref 70–99)
Potassium: 3.5 mmol/L (ref 3.5–5.1)
Sodium: 140 mmol/L (ref 135–145)
Total Bilirubin: 0.7 mg/dL (ref 0.0–1.2)
Total Protein: 5.7 g/dL — ABNORMAL LOW (ref 6.5–8.1)

## 2023-12-01 LAB — TROPONIN I (HIGH SENSITIVITY)
Troponin I (High Sensitivity): 9 ng/L (ref <18)
Troponin I (High Sensitivity): 9 ng/L (ref <18)

## 2023-12-01 LAB — CBC WITH DIFFERENTIAL/PLATELET
Abs Immature Granulocytes: 0.05 10*3/uL (ref 0.00–0.07)
Basophils Absolute: 0.1 10*3/uL (ref 0.0–0.1)
Basophils Relative: 1 %
Eosinophils Absolute: 0.1 10*3/uL (ref 0.0–0.5)
Eosinophils Relative: 1 %
HCT: 34.6 % — ABNORMAL LOW (ref 39.0–52.0)
Hemoglobin: 11.1 g/dL — ABNORMAL LOW (ref 13.0–17.0)
Immature Granulocytes: 1 %
Lymphocytes Relative: 20 %
Lymphs Abs: 1.9 10*3/uL (ref 0.7–4.0)
MCH: 31.6 pg (ref 26.0–34.0)
MCHC: 32.1 g/dL (ref 30.0–36.0)
MCV: 98.6 fL (ref 80.0–100.0)
Monocytes Absolute: 0.5 10*3/uL (ref 0.1–1.0)
Monocytes Relative: 5 %
Neutro Abs: 7.1 10*3/uL (ref 1.7–7.7)
Neutrophils Relative %: 72 %
Platelets: 178 10*3/uL (ref 150–400)
RBC: 3.51 MIL/uL — ABNORMAL LOW (ref 4.22–5.81)
RDW: 15.2 % (ref 11.5–15.5)
WBC: 9.7 10*3/uL (ref 4.0–10.5)
nRBC: 0 % (ref 0.0–0.2)

## 2023-12-01 LAB — PROTIME-INR
INR: 1.1 (ref 0.8–1.2)
Prothrombin Time: 14.7 s (ref 11.4–15.2)

## 2023-12-01 LAB — I-STAT CG4 LACTIC ACID, ED
Lactic Acid, Venous: 4.1 mmol/L (ref 0.5–1.9)
Lactic Acid, Venous: 2.6 mmol/L (ref 0.5–1.9)

## 2023-12-01 LAB — HEMOGLOBIN AND HEMATOCRIT, BLOOD
HCT: 33.8 % — ABNORMAL LOW (ref 39.0–52.0)
HCT: 32.2 % — ABNORMAL LOW (ref 39.0–52.0)
Hemoglobin: 11.2 g/dL — ABNORMAL LOW (ref 13.0–17.0)
Hemoglobin: 10.6 g/dL — ABNORMAL LOW (ref 13.0–17.0)

## 2023-12-01 LAB — ABO/RH: ABO/RH(D): O POS

## 2023-12-01 LAB — ETHANOL: Alcohol, Ethyl (B): 47 mg/dL — ABNORMAL HIGH (ref <10)

## 2023-12-01 LAB — LACTIC ACID, PLASMA
Lactic Acid, Venous: 1.2 mmol/L (ref 0.5–1.9)
Lactic Acid, Venous: 1.7 mmol/L (ref 0.5–1.9)

## 2023-12-01 LAB — RAPID URINE DRUG SCREEN, HOSP PERFORMED
Amphetamines: NOT DETECTED
Barbiturates: NOT DETECTED
Benzodiazepines: NOT DETECTED
Cocaine: POSITIVE — AB
Opiates: NOT DETECTED
Tetrahydrocannabinol: NOT DETECTED

## 2023-12-01 LAB — PREPARE RBC (CROSSMATCH)

## 2023-12-01 MED ORDER — ATORVASTATIN CALCIUM 10 MG PO TABS
20.0000 mg | ORAL_TABLET | Freq: Every day | ORAL | Status: DC
Start: 2023-12-01 — End: 2023-12-03
  Administered 2023-12-01 – 2023-12-03 (×3): 20 mg via ORAL
  Filled 2023-12-01 (×3): qty 2

## 2023-12-01 MED ORDER — PEG-KCL-NACL-NASULF-NA ASC-C 100 G PO SOLR
0.5000 | Freq: Once | ORAL | Status: AC
  Administered 2023-12-01: 100 g via ORAL
  Filled 2023-12-01: qty 1

## 2023-12-01 MED ORDER — PEG-KCL-NACL-NASULF-NA ASC-C 100 G PO SOLR: 1.0000 | Freq: Once | ORAL | Status: DC

## 2023-12-01 MED ORDER — TAMSULOSIN HCL 0.4 MG PO CAPS
0.4000 mg | ORAL_CAPSULE | Freq: Every day | ORAL | Status: DC
  Administered 2023-12-02 – 2023-12-03 (×2): 0.4 mg via ORAL
  Filled 2023-12-01 (×3): qty 1

## 2023-12-01 MED ORDER — ADULT MULTIVITAMIN W/MINERALS CH
1.0000 | ORAL_TABLET | Freq: Every day | ORAL | Status: DC
  Administered 2023-12-01 – 2023-12-03 (×3): 1 via ORAL
  Filled 2023-12-01 (×4): qty 1

## 2023-12-01 MED ORDER — PANTOPRAZOLE SODIUM 40 MG IV SOLR
40.0000 mg | Freq: Two times a day (BID) | INTRAVENOUS | Status: DC
  Administered 2023-12-01 – 2023-12-02 (×4): 40 mg via INTRAVENOUS
  Filled 2023-12-01 (×4): qty 10

## 2023-12-01 MED ORDER — PANTOPRAZOLE SODIUM 40 MG IV SOLR
40.0000 mg | Freq: Once | INTRAVENOUS | Status: AC
  Administered 2023-12-01: 40 mg via INTRAVENOUS
  Filled 2023-12-01: qty 10

## 2023-12-01 MED ORDER — SODIUM CHLORIDE 0.9% IV SOLUTION: Freq: Once | INTRAVENOUS | Status: AC

## 2023-12-01 MED ORDER — FOLIC ACID 1 MG PO TABS: 1.0000 mg | ORAL_TABLET | Freq: Every day | ORAL | Status: DC

## 2023-12-01 MED ORDER — SODIUM CHLORIDE 0.9 % IV SOLN
1.0000 mg | Freq: Every day | INTRAVENOUS | Status: DC
  Administered 2023-12-01: 1 mg via INTRAVENOUS
  Filled 2023-12-01 (×2): qty 0.2

## 2023-12-01 MED ORDER — THIAMINE HCL 100 MG/ML IJ SOLN
100.0000 mg | Freq: Every day | INTRAMUSCULAR | Status: DC
  Administered 2023-12-01: 100 mg via INTRAVENOUS
  Filled 2023-12-01 (×2): qty 2

## 2023-12-01 MED ORDER — SODIUM CHLORIDE 0.9 % IV BOLUS
1000.0000 mL | Freq: Once | INTRAVENOUS | Status: AC
  Administered 2023-12-01: 1000 mL via INTRAVENOUS

## 2023-12-01 MED ORDER — THIAMINE MONONITRATE 100 MG PO TABS
100.0000 mg | ORAL_TABLET | Freq: Every day | ORAL | Status: DC
  Administered 2023-12-02 – 2023-12-03 (×2): 100 mg via ORAL
  Filled 2023-12-01 (×4): qty 1

## 2023-12-01 MED ORDER — IOHEXOL 350 MG/ML SOLN
75.0000 mL | Freq: Once | INTRAVENOUS | Status: AC | PRN
  Administered 2023-12-01: 75 mL via INTRAVENOUS

## 2023-12-01 NOTE — Consult Note (Addendum)
Consultation  Referring Provider: [facility]/Upstill PA-C Primary Care Physician:  Olegario Messier, MD Primary Gastroenterologist:  unassigned  Reason for Consultation: Acute major lower GI bleed, syncope, hypotension  HPI: Kyle Zamora is a 63 y.o. male he of hypertension and BPH, who was brought to the emergency room by EMS early this morning after a syncopal episode at home.  He says he got up at about 3 AM to have a bowel movement, did note bloody stool, then on his way back to bed syncopized.  Per EMS was unconscious for about 5 minutes, hypotensive with systolic blood pressure in the 70s and was hypothermic. He has been volume repleted and transfused 1 unit of packed RBCs.  Currently blood pressure stable with systolic in the 130s.  Patient states that yesterday afternoon he had the urge for a bowel movement, was at [facility], went to the bathroom and noticed a lot of blood splashed on the sides of the commode.  He had to sit back down and had another grossly bloody bowel movement.  He went home and had 2-3 more episodes prior to going to bed. He says he had some abdominal pain/cramping a couple of days ago which was unusual for him but did not result in any nausea vomiting or bowel movement. He has not had any prior history of GI bleeding. No regular aspirin or NSAIDs. He does drink alcohol on a daily basis 4-5 drinks per day, and uses cocaine regularly, both used yesterday.  CT angio abdomen and pelvis was done after patient was stabilized, no active extravasation, liver appears normal spleen normal, there was no bowel wall thickening but did note occasional diverticuli.  Labs on arrival WBC 9.7/hemoglobin 11.1/Mehta crit 34.6/MCV 98 INR 1.1 EtOH 47 Drug screen positive for cocaine Potassium 3.5/BUN 7/creatinine 0.74 LFTs within normal limits Troponins negative x 2 Initial lactate was 4.1, on repeat 2.6  Has had a couple of further episodes of bloody stools over the past couple of  hours, small to medium amount of dark red blood  Follow-up hemoglobin pending  Has not had any prior colonoscopy or EGD.   Past Medical History:  Diagnosis Date   Benign prostate hyperplasia    Hypertension     Past Surgical History:  Procedure Laterality Date   OPEN REDUCTION INTERNAL FIXATION (ORIF) DISTAL RADIAL FRACTURE Right 12/20/2019   Procedure: OPEN REDUCTION INTERNAL FIXATION (ORIF) RIGHT RADIUS SHAFT;  Surgeon: Mack Hook, MD;  Location: [facility];  Service: Orthopedics;  Laterality: Right;  PRE-OP BLOCK   OTHER SURGICAL HISTORY     "skull surgery as an infant"    Prior to Admission medications   Medication Sig Start Date End Date Taking? Authorizing Provider  amLODipine (NORVASC) 5 MG tablet Take 1 tablet (5 mg total) by mouth daily. 10/17/23   Katheran James, DO  atorvastatin (LIPITOR) 20 MG tablet Take 1 tablet (20 mg total) by mouth daily. 10/17/23 10/16/24  Katheran James, DO  diclofenac Sodium (VOLTAREN ARTHRITIS PAIN) 1 % GEL Apply 2 g topically 4 (four) times daily. 07/04/23   Nooruddin, Jason Fila, MD  hydrochlorothiazide (HYDRODIURIL) 25 MG tablet Take 1 tablet (25 mg total) by mouth daily. 10/17/23   Katheran James, DO  tamsulosin (FLOMAX) 0.4 MG CAPS capsule Take 1 capsule (0.4 mg total) by mouth daily. 03/29/23   Quincy Simmonds, MD    Current Facility-Administered Medications  Medication Dose Route Frequency Provider Last Rate Last Admin   atorvastatin (LIPITOR) tablet 20 mg  20  mg Oral Daily Champ Mungo, DO       folic acid 1 mg in sodium chloride 0.9 % 50 mL IVPB  1 mg Intravenous Daily Mercie Eon, MD       multivitamin with minerals tablet 1 tablet  1 tablet Oral Daily Champ Mungo, DO       tamsulosin (FLOMAX) capsule 0.4 mg  0.4 mg Oral Daily Champ Mungo, DO       thiamine (VITAMIN B1) tablet 100 mg  100 mg Oral Daily Champ Mungo, DO       Or   thiamine (VITAMIN B1) injection 100 mg  100 mg Intravenous Daily Champ Mungo,  DO   100 mg at 12/01/23 1210   Current Outpatient Medications  Medication Sig Dispense Refill   amLODipine (NORVASC) 5 MG tablet Take 1 tablet (5 mg total) by mouth daily. 30 tablet 11   atorvastatin (LIPITOR) 20 MG tablet Take 1 tablet (20 mg total) by mouth daily. 90 tablet 3   diclofenac Sodium (VOLTAREN ARTHRITIS PAIN) 1 % GEL Apply 2 g topically 4 (four) times daily. 50 g 0   hydrochlorothiazide (HYDRODIURIL) 25 MG tablet Take 1 tablet (25 mg total) by mouth daily. 30 tablet 11   tamsulosin (FLOMAX) 0.4 MG CAPS capsule Take 1 capsule (0.4 mg total) by mouth daily. 30 capsule 11    Allergies as of 12/01/2023   (No Known Allergies)    Family History  Problem Relation Age of Onset   Cancer Mother    Hypertension Mother    Heart failure Father    Hypertension Father    Cancer Maternal Aunt        pancreatic cancer   Cancer Maternal Aunt    Cancer Other     Social History   Socioeconomic History   Marital status: Single    Spouse name: Not on file   Number of children: Not on file   Years of education: Not on file   Highest education level: Not on file  Occupational History   Not on file  Tobacco Use   Smoking status: Every Day    Types: Cigarettes   Smokeless tobacco: Never   Tobacco comments:    Smokes when he drinks alcohol, 45 years   Vaping Use   Vaping status: Never Used  Substance and Sexual Activity   Alcohol use: Yes    Alcohol/week: 12.0 standard drinks of alcohol    Types: 12 Standard drinks or equivalent per week    Comment: 2-3 12 ox beers once a week and 12 pack on weekends   Drug use: No   Sexual activity: Not Currently    Partners: Female  Other Topics Concern   Not on file  Social History Narrative   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, was a Statistician, currently looking for a job   Social Drivers of Corporate investment banker Strain: Medium Risk (10/16/2023)   Overall Financial Resource Strain (CARDIA)    Difficulty of Paying Living  Expenses: Somewhat hard  Food Insecurity: No Food Insecurity (10/16/2023)   Hunger Vital Sign    Worried About Running Out of Food in the Last Year: Never true    Ran Out of Food in the Last Year: Never true  Transportation Needs: No Transportation Needs (10/16/2023)   PRAPARE - Administrator, Civil Service (Medical): No    Lack of Transportation (Non-Medical): No  Physical Activity: Inactive (10/16/2023)   Exercise Vital Sign  Days of Exercise per Week: 0 days    Minutes of Exercise per Session: 0 min  Stress: No Stress Concern Present (10/16/2023)   [employer] of Occupational Health - Occupational Stress Questionnaire    Feeling of Stress : Not at all  Social Connections: Moderately Isolated (10/16/2023)   Social Connection and Isolation Panel [NHANES]    Frequency of Communication with Friends and Family: Twice a week    Frequency of Social Gatherings with Friends and Family: Once a week    Attends Religious Services: Never    Database administrator or Organizations: No    Attends Banker Meetings: Never    Marital Status: Living with partner  Intimate Partner Violence: Not At Risk (10/16/2023)   Humiliation, Afraid, Rape, and Kick questionnaire    Fear of Current or Ex-Partner: No    Emotionally Abused: No    Physically Abused: No    Sexually Abused: No    Review of Systems: Pertinent positive and negative review of systems were noted in the above HPI section.  All other review of systems was otherwise negative. Marland Kitchen  Physical Exam: Vital signs in last 24 hours: Temp:  [95.3 F (35.2 C)-98 F (36.7 C)] 98 F (36.7 C) (01/24 1012) Pulse Rate:  [63-77] 70 (01/24 1159) Resp:  [13-23] 18 (01/24 1009) BP: (109-141)/(64-96) 141/86 (01/24 1159) SpO2:  [100 %] 100 % (01/24 1009) Weight:  [79.4 kg] 79.4 kg (01/24 0601)   General:   Alert,  Well-developed, well-nourished, older African-American male, pleasant and cooperative in NAD, family at  bedside Head:  Normocephalic and atraumatic.  Abrasion/ecchymoses over the left temporal area and cheek Eyes:  Sclera clear, no icterus.   Conjunctiva pink. Ears:  Normal auditory acuity. Nose:  No deformity, discharge,  or lesions. Mouth:  No deformity or lesions.   Neck:  Supple; no masses or thyromegaly. Lungs:  Clear throughout to auscultation.   No wheezes, crackles, or rhonchi.  Heart:  Regular rate and rhythm; no murmurs, clicks, rubs,  or gallops. Abdomen:  Soft,nontender, BS active,nonpalp mass or hsm.   Rectal:   Frank red blood per rectum on arrival Msk:  Symmetrical without gross deformities. . Pulses:  Normal pulses noted. Extremities:  Without clubbing or edema. Neurologic:  Alert and  oriented x4;  grossly normal neurologically. Skin:  Intact without significant lesions or rashes.. Psych:  Alert and cooperative. Normal mood and affect.  Intake/Output from previous day: 01/23 0701 - 01/24 0700 In: 375 [I.V.:100; Blood:275] Out: -  Intake/Output this shift: Total I/O In: 1000 [IV Piggyback:1000] Out: 1155 [Urine:1155]  Lab Results: Recent Labs    12/01/23 0613  WBC 9.7  HGB 11.1*  HCT 34.6*  PLT 178   BMET Recent Labs    12/01/23 0613  NA 140  K 3.5  CL 106  CO2 22  GLUCOSE 105*  BUN 7*  CREATININE 0.74  CALCIUM 8.5*   LFT Recent Labs    12/01/23 0613  PROT 5.7*  ALBUMIN 3.1*  AST 17  ALT 15  ALKPHOS 60  BILITOT 0.7   PT/INR Recent Labs    12/01/23 0613  LABPROT 14.7  INR 1.1   Hepatitis Panel No results for input(s): "HEPBSAG", "HCVAB", "HEPAIGM", "HEPBIGM" in the last 72 hours.   IMPRESSION:  #49 63 year old African-American male with history of hypertension and BPH, admitted this morning with acute major lower GI bleed onset yesterday evening with 3-4 episodes of grossly bloody stools.  He got up  at about 3 AM to have another bowel movement which was bloody, syncopal episode on the way back to bed. He was hypotensive when EMS  arrived systolic blood pressure in the 70s, hypothermic and had been unresponsive for about 5 minutes.  Has been resuscitated, volume repleted and had 1 unit of packed RBCs.  He is currently mentating well and systolic blood pressure in the 130s  CT angio does not show any active extravasation, normal-appearing liver and spleen, no bowel abnormalities other than some occasional diverticuli  Etiology of his bleeding is not entirely clear, this may be diverticular will need to rule out other mucosal lesion.  So cannot completely exclude upper GI sources though BUN is normal, rule out brisk upper GI bleed.  # 2 anemia acute secondary to acute GI blood loss # 3 substance abuse-daily EtOH and cocaine  #4 history of hypertension #5.  BPH   PLAN: Okay for clear liquids, n.p.o. after midnight Check hemoglobin every 6 hours and transfuse as indicated to keep his hemoglobin above 7 Patient will be scheduled for colonoscopy/EGD with Dr. Chales Abrahams tomorrow morning.  Both procedures were discussed in detail with the patient including indications risk benefits and he is agreeable to proceed. Cover with IV PPI twice daily Patient has further active GI bleeding today with any change in hemodynamics he may need to go back for repeat CTA, and if positive then IR for embolization. GI will follow with you   Amy Esterwood PA-C 12/01/2023, 12:35 PM     Attending physician's note   I have taken history, reviewed the chart and examined the patient. I performed a substantive portion of this encounter, including complete performance of at least one of the key components, in conjunction with the APP. I agree with the Advanced Practitioner's note, impression and recommendations.   GI bleed with syncope- Upper vs lower. Hb 14 to 11. HD stable. CTA neg  Multisubstance abuse-including EtOH/cocaine. No cirrhosis. Nl plts/INR. ETOH level was elevated. Associated HTN, BPH  Plan: -IV Protonix -Trend  CBC -EGD/colonoscopy in AM  -If any active bleeding, CTA followed by IR embolization -Watch for alcohol withdrawal/DTs.  I have explained risks and benefits with patient in detail.  Edman Circle, MD [facility] [phone]

## 2023-12-01 NOTE — H&P (View-Only) (Signed)
Consultation  Referring Provider: [facility]/Upstill PA-C Primary Care Physician:  Olegario Messier, MD Primary Gastroenterologist:  unassigned  Reason for Consultation: Acute major lower GI bleed, syncope, hypotension  HPI: Kyle Zamora is a 63 y.o. male he of hypertension and BPH, who was brought to the emergency room by EMS early this morning after a syncopal episode at home.  He says he got up at about 3 AM to have a bowel movement, did note bloody stool, then on his way back to bed syncopized.  Per EMS was unconscious for about 5 minutes, hypotensive with systolic blood pressure in the 70s and was hypothermic. He has been volume repleted and transfused 1 unit of packed RBCs.  Currently blood pressure stable with systolic in the 130s.  Patient states that yesterday afternoon he had the urge for a bowel movement, was at [facility], went to the bathroom and noticed a lot of blood splashed on the sides of the commode.  He had to sit back down and had another grossly bloody bowel movement.  He went home and had 2-3 more episodes prior to going to bed. He says he had some abdominal pain/cramping a couple of days ago which was unusual for him but did not result in any nausea vomiting or bowel movement. He has not had any prior history of GI bleeding. No regular aspirin or NSAIDs. He does drink alcohol on a daily basis 4-5 drinks per day, and uses cocaine regularly, both used yesterday.  CT angio abdomen and pelvis was done after patient was stabilized, no active extravasation, liver appears normal spleen normal, there was no bowel wall thickening but did note occasional diverticuli.  Labs on arrival WBC 9.7/hemoglobin 11.1/Mehta crit 34.6/MCV 98 INR 1.1 EtOH 47 Drug screen positive for cocaine Potassium 3.5/BUN 7/creatinine 0.74 LFTs within normal limits Troponins negative x 2 Initial lactate was 4.1, on repeat 2.6  Has had a couple of further episodes of bloody stools over the past couple of  hours, small to medium amount of dark red blood  Follow-up hemoglobin pending  Has not had any prior colonoscopy or EGD.   Past Medical History:  Diagnosis Date   Benign prostate hyperplasia    Hypertension     Past Surgical History:  Procedure Laterality Date   OPEN REDUCTION INTERNAL FIXATION (ORIF) DISTAL RADIAL FRACTURE Right 12/20/2019   Procedure: OPEN REDUCTION INTERNAL FIXATION (ORIF) RIGHT RADIUS SHAFT;  Surgeon: Mack Hook, MD;  Location: [facility];  Service: Orthopedics;  Laterality: Right;  PRE-OP BLOCK   OTHER SURGICAL HISTORY     "skull surgery as an infant"    Prior to Admission medications   Medication Sig Start Date End Date Taking? Authorizing Provider  amLODipine (NORVASC) 5 MG tablet Take 1 tablet (5 mg total) by mouth daily. 10/17/23   Katheran James, DO  atorvastatin (LIPITOR) 20 MG tablet Take 1 tablet (20 mg total) by mouth daily. 10/17/23 10/16/24  Katheran James, DO  diclofenac Sodium (VOLTAREN ARTHRITIS PAIN) 1 % GEL Apply 2 g topically 4 (four) times daily. 07/04/23   Nooruddin, Jason Fila, MD  hydrochlorothiazide (HYDRODIURIL) 25 MG tablet Take 1 tablet (25 mg total) by mouth daily. 10/17/23   Katheran James, DO  tamsulosin (FLOMAX) 0.4 MG CAPS capsule Take 1 capsule (0.4 mg total) by mouth daily. 03/29/23   Quincy Simmonds, MD    Current Facility-Administered Medications  Medication Dose Route Frequency Provider Last Rate Last Admin   atorvastatin (LIPITOR) tablet 20 mg  20  mg Oral Daily Champ Mungo, DO       folic acid 1 mg in sodium chloride 0.9 % 50 mL IVPB  1 mg Intravenous Daily Mercie Eon, MD       multivitamin with minerals tablet 1 tablet  1 tablet Oral Daily Champ Mungo, DO       tamsulosin (FLOMAX) capsule 0.4 mg  0.4 mg Oral Daily Champ Mungo, DO       thiamine (VITAMIN B1) tablet 100 mg  100 mg Oral Daily Champ Mungo, DO       Or   thiamine (VITAMIN B1) injection 100 mg  100 mg Intravenous Daily Champ Mungo,  DO   100 mg at 12/01/23 1210   Current Outpatient Medications  Medication Sig Dispense Refill   amLODipine (NORVASC) 5 MG tablet Take 1 tablet (5 mg total) by mouth daily. 30 tablet 11   atorvastatin (LIPITOR) 20 MG tablet Take 1 tablet (20 mg total) by mouth daily. 90 tablet 3   diclofenac Sodium (VOLTAREN ARTHRITIS PAIN) 1 % GEL Apply 2 g topically 4 (four) times daily. 50 g 0   hydrochlorothiazide (HYDRODIURIL) 25 MG tablet Take 1 tablet (25 mg total) by mouth daily. 30 tablet 11   tamsulosin (FLOMAX) 0.4 MG CAPS capsule Take 1 capsule (0.4 mg total) by mouth daily. 30 capsule 11    Allergies as of 12/01/2023   (No Known Allergies)    Family History  Problem Relation Age of Onset   Cancer Mother    Hypertension Mother    Heart failure Father    Hypertension Father    Cancer Maternal Aunt        pancreatic cancer   Cancer Maternal Aunt    Cancer Other     Social History   Socioeconomic History   Marital status: Single    Spouse name: Not on file   Number of children: Not on file   Years of education: Not on file   Highest education level: Not on file  Occupational History   Not on file  Tobacco Use   Smoking status: Every Day    Types: Cigarettes   Smokeless tobacco: Never   Tobacco comments:    Smokes when he drinks alcohol, 45 years   Vaping Use   Vaping status: Never Used  Substance and Sexual Activity   Alcohol use: Yes    Alcohol/week: 12.0 standard drinks of alcohol    Types: 12 Standard drinks or equivalent per week    Comment: 2-3 12 ox beers once a week and 12 pack on weekends   Drug use: No   Sexual activity: Not Currently    Partners: Female  Other Topics Concern   Not on file  Social History Narrative   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, was a Statistician, currently looking for a job   Social Drivers of Corporate investment banker Strain: Medium Risk (10/16/2023)   Overall Financial Resource Strain (CARDIA)    Difficulty of Paying Living  Expenses: Somewhat hard  Food Insecurity: No Food Insecurity (10/16/2023)   Hunger Vital Sign    Worried About Running Out of Food in the Last Year: Never true    Ran Out of Food in the Last Year: Never true  Transportation Needs: No Transportation Needs (10/16/2023)   PRAPARE - Administrator, Civil Service (Medical): No    Lack of Transportation (Non-Medical): No  Physical Activity: Inactive (10/16/2023)   Exercise Vital Sign  Days of Exercise per Week: 0 days    Minutes of Exercise per Session: 0 min  Stress: No Stress Concern Present (10/16/2023)   [employer] of Occupational Health - Occupational Stress Questionnaire    Feeling of Stress : Not at all  Social Connections: Moderately Isolated (10/16/2023)   Social Connection and Isolation Panel [NHANES]    Frequency of Communication with Friends and Family: Twice a week    Frequency of Social Gatherings with Friends and Family: Once a week    Attends Religious Services: Never    Database administrator or Organizations: No    Attends Banker Meetings: Never    Marital Status: Living with partner  Intimate Partner Violence: Not At Risk (10/16/2023)   Humiliation, Afraid, Rape, and Kick questionnaire    Fear of Current or Ex-Partner: No    Emotionally Abused: No    Physically Abused: No    Sexually Abused: No    Review of Systems: Pertinent positive and negative review of systems were noted in the above HPI section.  All other review of systems was otherwise negative. Marland Kitchen  Physical Exam: Vital signs in last 24 hours: Temp:  [95.3 F (35.2 C)-98 F (36.7 C)] 98 F (36.7 C) (01/24 1012) Pulse Rate:  [63-77] 70 (01/24 1159) Resp:  [13-23] 18 (01/24 1009) BP: (109-141)/(64-96) 141/86 (01/24 1159) SpO2:  [100 %] 100 % (01/24 1009) Weight:  [79.4 kg] 79.4 kg (01/24 0601)   General:   Alert,  Well-developed, well-nourished, older African-American male, pleasant and cooperative in NAD, family at  bedside Head:  Normocephalic and atraumatic.  Abrasion/ecchymoses over the left temporal area and cheek Eyes:  Sclera clear, no icterus.   Conjunctiva pink. Ears:  Normal auditory acuity. Nose:  No deformity, discharge,  or lesions. Mouth:  No deformity or lesions.   Neck:  Supple; no masses or thyromegaly. Lungs:  Clear throughout to auscultation.   No wheezes, crackles, or rhonchi.  Heart:  Regular rate and rhythm; no murmurs, clicks, rubs,  or gallops. Abdomen:  Soft,nontender, BS active,nonpalp mass or hsm.   Rectal:   Frank red blood per rectum on arrival Msk:  Symmetrical without gross deformities. . Pulses:  Normal pulses noted. Extremities:  Without clubbing or edema. Neurologic:  Alert and  oriented x4;  grossly normal neurologically. Skin:  Intact without significant lesions or rashes.. Psych:  Alert and cooperative. Normal mood and affect.  Intake/Output from previous day: 01/23 0701 - 01/24 0700 In: 375 [I.V.:100; Blood:275] Out: -  Intake/Output this shift: Total I/O In: 1000 [IV Piggyback:1000] Out: 1155 [Urine:1155]  Lab Results: Recent Labs    12/01/23 0613  WBC 9.7  HGB 11.1*  HCT 34.6*  PLT 178   BMET Recent Labs    12/01/23 0613  NA 140  K 3.5  CL 106  CO2 22  GLUCOSE 105*  BUN 7*  CREATININE 0.74  CALCIUM 8.5*   LFT Recent Labs    12/01/23 0613  PROT 5.7*  ALBUMIN 3.1*  AST 17  ALT 15  ALKPHOS 60  BILITOT 0.7   PT/INR Recent Labs    12/01/23 0613  LABPROT 14.7  INR 1.1   Hepatitis Panel No results for input(s): "HEPBSAG", "HCVAB", "HEPAIGM", "HEPBIGM" in the last 72 hours.   IMPRESSION:  #49 63 year old African-American male with history of hypertension and BPH, admitted this morning with acute major lower GI bleed onset yesterday evening with 3-4 episodes of grossly bloody stools.  He got up  at about 3 AM to have another bowel movement which was bloody, syncopal episode on the way back to bed. He was hypotensive when EMS  arrived systolic blood pressure in the 70s, hypothermic and had been unresponsive for about 5 minutes.  Has been resuscitated, volume repleted and had 1 unit of packed RBCs.  He is currently mentating well and systolic blood pressure in the 130s  CT angio does not show any active extravasation, normal-appearing liver and spleen, no bowel abnormalities other than some occasional diverticuli  Etiology of his bleeding is not entirely clear, this may be diverticular will need to rule out other mucosal lesion.  So cannot completely exclude upper GI sources though BUN is normal, rule out brisk upper GI bleed.  # 2 anemia acute secondary to acute GI blood loss # 3 substance abuse-daily EtOH and cocaine  #4 history of hypertension #5.  BPH   PLAN: Okay for clear liquids, n.p.o. after midnight Check hemoglobin every 6 hours and transfuse as indicated to keep his hemoglobin above 7 Patient will be scheduled for colonoscopy/EGD with Dr. Chales Abrahams tomorrow morning.  Both procedures were discussed in detail with the patient including indications risk benefits and he is agreeable to proceed. Cover with IV PPI twice daily Patient has further active GI bleeding today with any change in hemodynamics he may need to go back for repeat CTA, and if positive then IR for embolization. GI will follow with you   Amy Esterwood PA-C 12/01/2023, 12:35 PM     Attending physician's note   I have taken history, reviewed the chart and examined the patient. I performed a substantive portion of this encounter, including complete performance of at least one of the key components, in conjunction with the APP. I agree with the Advanced Practitioner's note, impression and recommendations.   GI bleed with syncope- Upper vs lower. Hb 14 to 11. HD stable. CTA neg  Multisubstance abuse-including EtOH/cocaine. No cirrhosis. Nl plts/INR. ETOH level was elevated. Associated HTN, BPH  Plan: -IV Protonix -Trend  CBC -EGD/colonoscopy in AM  -If any active bleeding, CTA followed by IR embolization -Watch for alcohol withdrawal/DTs.  I have explained risks and benefits with patient in detail.  Edman Circle, MD [facility] [phone]

## 2023-12-01 NOTE — ED Triage Notes (Signed)
Pt BIB [facility] from home. Had a witnessed syncopal episode. LOC for 3-5 min. Hit head on L side. Denies head & neck pain. C/o R arm pain, "pins and needles". Rectal bleeding since lunch time yesterday. Pt endorses ETOH use. Hx of cocaine use, pt reports last use today.   EMS 74/50 BP 54 P 99% RA 152 CBG 450 NS post fluids 90/58 18 LAC

## 2023-12-01 NOTE — ED Provider Notes (Signed)
Kyle Zamora EMERGENCY DEPARTMENT AT [facility] Provider Note   CSN: [id number] Arrival date & time: 12/01/23  0542     History  Chief Complaint  Patient presents with   Rectal Bleeding   Hypotension   Loss of Consciousness    Kyle Zamora is a 63 y.o. male who presents via EMS with concern for syncopal episode.  Patient reports 24 hours of frank rectal bleeding, remote history of same, regular alcohol user, uses cocaine most recently today.  Per EMS patient syncopized, wife called the ambulance reportedly unconscious for approximately 5 minutes.  Frank red blood per rectum at time of EMS arrival, SBP 70s, received 500 cc IV fluids and route, patient very cold to the touch.  Level 5 caveat due to acuity of presentation upon arrival.  Patient is supposed to take antihypertensive but has not been taking his medications for approximately 5 months.  HPI     Home Medications Prior to Admission medications   Medication Sig Start Date End Date Taking? Authorizing Provider  amLODipine (NORVASC) 5 MG tablet Take 1 tablet (5 mg total) by mouth daily. 10/17/23   Katheran James, DO  atorvastatin (LIPITOR) 20 MG tablet Take 1 tablet (20 mg total) by mouth daily. 10/17/23 10/16/24  Katheran James, DO  diclofenac Sodium (VOLTAREN ARTHRITIS PAIN) 1 % GEL Apply 2 g topically 4 (four) times daily. 07/04/23   Nooruddin, Jason Fila, MD  hydrochlorothiazide (HYDRODIURIL) 25 MG tablet Take 1 tablet (25 mg total) by mouth daily. 10/17/23   Katheran James, DO  tamsulosin (FLOMAX) 0.4 MG CAPS capsule Take 1 capsule (0.4 mg total) by mouth daily. 03/29/23   Quincy Simmonds, MD      Allergies    Patient has no known allergies.    Review of Systems   Review of Systems  Unable to perform ROS: Acuity of condition    Physical Exam Updated Vital Signs BP (!) 131/96 (BP Location: Right Arm)   Pulse 76   Temp (!) 95.3 F (35.2 C) (Rectal)   Resp (!) 23   Ht 5\' 11"  (1.803 m)   Wt  79.4 kg   SpO2 100%   BMI 24.41 kg/m  Physical Exam Vitals and nursing note reviewed. Exam conducted with a chaperone present (ED RN Porfirio Mylar).  Constitutional:      Appearance: He is toxic-appearing.  HENT:     Head: Normocephalic and atraumatic.     Mouth/Throat:     Mouth: Mucous membranes are moist.     Pharynx: No oropharyngeal exudate or posterior oropharyngeal erythema.  Eyes:     General:        Right eye: No discharge.        Left eye: No discharge.     Conjunctiva/sclera: Conjunctivae normal.     Pupils: Pupils are equal, round, and reactive to light.     Comments: Conjunctival pallor  Cardiovascular:     Rate and Rhythm: Normal rate and regular rhythm.     Pulses: Normal pulses.     Heart sounds: Normal heart sounds. No murmur heard. Pulmonary:     Effort: Pulmonary effort is normal. No tachypnea, bradypnea, accessory muscle usage, prolonged expiration or respiratory distress.     Breath sounds: Normal breath sounds. No wheezing or rales.  Chest:     Chest wall: No mass, lacerations, deformity, swelling, tenderness, crepitus or edema.    Abdominal:     General: Bowel sounds are normal. There is no distension.  Palpations: Abdomen is soft.     Tenderness: There is no abdominal tenderness. There is no right CVA tenderness, left CVA tenderness, guarding or rebound.  Genitourinary:    Comments: Frank red blood and clotting per rectum soaking the patient's clothing and sheets no active hemorrhage at this time. Rectal temp 95 degrees F Musculoskeletal:        General: No deformity.     Cervical back: Neck supple.  Skin:    General: Skin is cool and dry.  Neurological:     Mental Status: He is lethargic.     GCS: GCS eye subscore is 2. GCS verbal subscore is 4. GCS motor subscore is 5.  Psychiatric:        Mood and Affect: Mood normal.     ED Results / Procedures / Treatments   Labs (all labs ordered are listed, but only abnormal results are displayed) Labs  Reviewed  CBC WITH DIFFERENTIAL/PLATELET - Abnormal; Notable for the following components:      Result Value   RBC 3.51 (*)    Hemoglobin 11.1 (*)    HCT 34.6 (*)    All other components within normal limits  ETHANOL - Abnormal; Notable for the following components:   Alcohol, Ethyl (B) 47 (*)    All other components within normal limits  I-STAT CG4 LACTIC ACID, ED - Abnormal; Notable for the following components:   Lactic Acid, Venous 4.1 (*)    All other components within normal limits  PROTIME-INR  COMPREHENSIVE METABOLIC PANEL  RAPID URINE DRUG SCREEN, HOSP PERFORMED  I-STAT CG4 LACTIC ACID, ED  TYPE AND SCREEN  PREPARE RBC (CROSSMATCH)  ABO/RH  TROPONIN I (HIGH SENSITIVITY)  TROPONIN I (HIGH SENSITIVITY)    EKG None  Radiology DG Chest Portable 1 View Result Date: 12/01/2023 CLINICAL DATA:  63 year old male status post fall. EXAM: PORTABLE CHEST 1 VIEW COMPARISON:  12/09/2019 chest radiographs. FINDINGS: Portable AP supine view at 0631 hours. Lung volumes and mediastinal contours are stable and within normal limits. Visualized tracheal air column is within normal limits. Allowing for portable technique the lungs are clear. No pneumothorax or pleural effusion identified on this supine view. No acute osseous abnormality identified. IMPRESSION: No acute cardiopulmonary abnormality or acute traumatic injury identified. Electronically Signed   By: Odessa Fleming M.D.   On: 12/01/2023 06:42    Procedures .Critical Care  Performed by: Paris Lore, PA-C Authorized by: Paris Lore, PA-C   Critical care provider statement:    Critical care time (minutes):  45   Critical care was time spent personally by me on the following activities:  Development of treatment plan with patient or surrogate, discussions with consultants, evaluation of patient's response to treatment, examination of patient, obtaining history from patient or surrogate, ordering and performing treatments  and interventions, ordering and review of laboratory studies, ordering and review of radiographic studies, pulse oximetry and re-evaluation of patient's condition     Medications Ordered in ED Medications  sodium chloride 0.9 % bolus 1,000 mL (0 mLs Intravenous Stopped 12/01/23 0723)  pantoprazole (PROTONIX) injection 40 mg (40 mg Intravenous Given 12/01/23 0634)  0.9 %  sodium chloride infusion (Manually program via Guardrails IV Fluids) (0 mLs Intravenous Stopped 12/01/23 0724)    ED Course/ Medical Decision Making/ A&P Clinical Course as of 12/01/23 0728  Fri Dec 01, 2023  0606 Patient cold to the touch. Core temp 95 Degrees, bair hugger ordered, single unit emergency release blood ordered, patient critically ill,  frank blood soaking EMS sheet, no active hemorrhage on rectal exam at time of rectal temp.  [RS]    Clinical Course User Index [RS] Hayley Horn, Eugene Gavia, PA-C                                 Medical Decision Making 63 year old male who presents with frank rectal bleeding, syncope and hypotension, toxic appearing.  Hypothermic, borderline systolic blood pressures upon arrival.  Cardiopulmonary exam unremarkable, abdominal exam is benign.  Good blood per rectum.  GCS of 11, patient cool to the touch.  Differential diagnosis includes but is not limited to upper GI bleeding with question variceal bleeding, peptic ulcer disease, coagulopathy, gastritis, malignancy, lower GI bleed.  Amount and/or Complexity of Data Reviewed Labs: ordered.    Details: CBC with anemia with hemoglobin 11.1 lactic acid elevated to 4.1, ETOH 47. INR normal Radiology: ordered.  Risk Prescription drug management.   Care of this patient signed out to oncoming ED provider S. Upstill, PA-C at time of shift change. All pertinent HPI, physical exam, and laboratory findings were discussed with them prior to my departure. Disposition of patient pending completion of workup, reevaluation, and clinical  judgement of oncoming ED provider.   This chart was dictated using voice recognition software, Dragon. Despite the best efforts of this provider to proofread and correct errors, errors may still occur which can change documentation meaning.        Final Clinical Impression(s) / ED Diagnoses Final diagnoses:  None    Rx / DC Orders ED Discharge Orders     None         Sherrilee Gilles 12/01/23 0728    Glendora Score, MD 12/02/23 [phone]

## 2023-12-01 NOTE — Discharge Instructions (Signed)
Outpatient Substance Abuse  Treatment- uninsured  Narcotics Anonymous 24-HOUR HELPLINE Pre-recorded for Meeting Schedules [facility] [phone]  [email or web address] ALCOHOLICS ANONYMOUS  [facility]  Answering Service [phone] Please Note: All [place] Meetings are Non-smoking [email or web address].es  Alcohol and Drug Services -  Insurance: Medicaid /State funding/private insurance Methadone, suboxone/Intensive outpatient  [place] [phone] Fax: [phone] [address] [place], [place], [address] [place] [phone] Fax: [phone]    [address] [place], [place], [address] ([address] [place], [place], [place], [place], [place], [place], [place], [place]) [facility] [email or web address] Accepts State funding/Medicaid Transitional housing, Intensive Outpatient Treatment, Outpatient treatment, Veterans Services  Phone: [phone] Fax: [phone] Address: [address] [place] [address]   [facility] [employer] ([email or web address]/) Insurance: Medicaid Case Management, Administrator, arts, Medication Management, Outpatient Therapy, Psychosocial Rehabilitation, Substance Abuse Intensive Outpatient  Phone: [phone] Fax: [phone] [address], [place], [place], [address]  [facility] Medicaid, most private insurance providers Types of Program: Individual/Group Therapy, Substance Abuse Treatment  Phone: [place] [phone] Fax: [phone] [address], [place], [place], [address] [place] [phone] [address] [place], [place], [address]  [facility]  Medicaid Types of Program: SAIOP  Phone: [phone] Fax: [phone] [address] [place], [place], [place], [address] [facility] Medicaid/state funds Crisis line [phone] [place] [phone] [place] [phone] [place] [phone]  [facility] [address];  [place], [place] [address] [phone]  Substance Abuse Intensive Outpatient Program [facility] [address] [place], [place] [address] [phone] Substance abuse treatment  [facility]  Insurance: [facility], [address], sliding scale Types of Program: substance abuse treatment, transportation assistance Phone: [phone] Fax: [phone] Address: [address], [place] [address] [facility] ([email or web address]) Insurance: UHC, [place], [place], [place] Program: addiction counseling, detoxification,  Phone: [phone]  Fax: [phone] Address: [address] [place], [place] [address]  [facility] (statewide facilities/programs) [address] (Medicaid/state funds) [place], [place] [address]                      http://barrett.[email or web address]/ [phone] Marcy Panning- [phone] [place]- [phone] [facility] [place] (2 Locations) (Medicaid/state funds) --[address]  walk in 8:30-12 and 1-2:30 [place], [id number]   [facility]- [phone] --[address] [place], [place] [address]  [phone] walk in 8:30-12 and 2-3:30  [facility] state funds/medicaid [address] [place], [place] [address] [phone] [facility] (Suboxone clinic) Medicaid/state funds  [address]  [place], [place] [address] [phone]   [facility]  [address], [place], [place] [address]  [phone] (24 hours) [place]- [address] [place] [address] [place], [place] [address]  [phone] (24 hours) [place]- [address] [place] [phone] [place]- [address] Rosalita Levan [phone] Turner Daniels [address] [place] [phone] [facility]- Medicaid and state funds  [place]- [address] [place], [place] [address] [phone] (24 hours) [place]- [address] [place], [place]  [address] [phone] [facility]- [address] [place], [place] [address] [phone] (24 hours) [place] [address] [place], [place]  [address] [phone] [place]- [address] [place] [phone]

## 2023-12-01 NOTE — [facility] Notes (Signed)
CSW added substance abuse resources to patient's AVS.  Edwin Dada, MSW, LCSW Transitions of Care  Clinical Social Worker II [phone]

## 2023-12-01 NOTE — ED Notes (Addendum)
Per ED PA Elpidio Anis, maintain bair hugger and check patient's rectal temp every hour.

## 2023-12-01 NOTE — ED Notes (Signed)
Pt currently on his knees on the stretcher using the urinal, stating "this is the only way I can pee."

## 2023-12-01 NOTE — H&P (Cosign Needed Addendum)
Date: 12/01/2023               Patient Name:  Kyle Zamora MRN: [id number]  DOB: August 22, 1961 Age / Sex: 63 y.o., male   PCP: Olegario Messier, MD         Medical Service: Internal Medicine Teaching Service         Attending Physician: Dr. Mercie Eon, MD    First Contact: Dr. Laretta Bolster Pager: [phone]  Second Contact: Dr. Rana Snare Pager: [phone]       After Hours (After 5p/  First Contact Pager: [phone]  weekends / holidays): Second Contact Pager: [phone]   Chief Complaint: BRBPR  History of Present Illness:   Kyle Zamora is a 63 y.o. M with PMH hypertension, BPPH who presents with 1 day of BRBPR admitted for GI bleed.  [name] says that 3 days ago he had some abdominal discomfort associated with a bowel movement that resolved. This morning he left work and caught the bus to [employer] where he went to use the restroom and noticed his BM was liquid. When he flushed the toilet he noticed red splatters on the bowl. He had an additional BM while at [facility] that was red, and when he got home had another red-colored BM. He didn't appreciate much stool contents mixed with the blood. He went to bed and woke up around 330A to go to the restroom. He felt dizzy but remembered leaving the restroom; the next thing he remembered was waking up on the floor and his wife placing a cold rag on his face. He felt extremely weak and light-headed. He had episodes of BRBPR in the past and says at that time he was drinking a lot of liquor and eating jalepeno peppers, but stopped those behaviors and hasn't had recurrence until now. He has no family history of colon cancer, has not had a colonoscopy yet. Since arrival to the ED and treatment measures thus far he feels significantly better.  On arrival to the ED he received 1 unit PRBC and 1L NS. He was hypothermic and had Bair Hugger placed.  There was frank blood noted on rectal exam when rectal temperature was taken. Pertinent labs include  Hgb  11.1; normal PT-INR; ethanol 47; lactic acid 4.1>2.6; UDS positive for cocaine. Imaging without obvious source of bleeding at this time. EDP has contacted GI. IMTS paged for admission for GI bleed.  Past Medical History: Past Medical History:  Diagnosis Date   Benign prostate hyperplasia    Hypertension    Past Surgical History: Past Surgical History:  Procedure Laterality Date   OPEN REDUCTION INTERNAL FIXATION (ORIF) DISTAL RADIAL FRACTURE Right 12/20/2019   Procedure: OPEN REDUCTION INTERNAL FIXATION (ORIF) RIGHT RADIUS SHAFT;  Surgeon: Mack Hook, MD;  Location: [facility];  Service: Orthopedics;  Laterality: Right;  PRE-OP BLOCK   OTHER SURGICAL HISTORY     "skull surgery as an infant"   Meds:  Amlodipine 5 mg daily Atorvastatin 20 mg daily Voltaren gel 4 applications daily Hydrochlorothiazide 25 mg daily Tamsulosin 0.4 mg daily  Allergies: Allergies as of 12/01/2023   (No Known Allergies)   Family History:  Family History  Problem Relation Age of Onset   Cancer Mother    Hypertension Mother    Heart failure Father    Hypertension Father    Cancer Maternal Aunt        pancreatic cancer   Cancer Maternal Aunt    Cancer Other  Social History: PCP is through [facility]. He endorses heavy drinking--4-5 beers daily, sometimes 6-8; last drink was yesterday and he denies history of withdrawal. He smokes about 1 pack of cigarettes per week, no vaping. He uses cocaine, last use yesterday.   Review of Systems: A complete ROS was negative except as per HPI.   Physical Exam: Blood pressure 138/72, pulse 76, temperature 98 F (36.7 C), temperature source Rectal, resp. rate 18, height 5\' 11"  (1.803 m), weight 79.4 kg, SpO2 100%. Physical Exam Constitutional:      General: He is not in acute distress.    Appearance: He is not toxic-appearing.  Cardiovascular:     Rate and Rhythm: Normal rate and regular rhythm.  Pulmonary:     Effort: Pulmonary effort is  normal.     Breath sounds: Normal breath sounds.  Abdominal:     General: There is no distension.     Palpations: Abdomen is soft.     Tenderness: There is no abdominal tenderness. There is no guarding.  Musculoskeletal:     Right lower leg: No edema.     Left lower leg: No edema.  Skin:    General: Skin is warm and dry.  Neurological:     General: No focal deficit present.     Mental Status: He is alert and oriented to person, place, and time.  Psychiatric:        Mood and Affect: Mood normal.        Behavior: Behavior normal.        Thought Content: Thought content normal.    EKG:NSR  CXR: No acute cardiopulmonary abnormality or acute traumatic injury identified.  CT head, cervical spine, maxilofacial: 1. No evidence of acute intracranial abnormality. 2. No acute maxillofacial or cervical spine fracture. 3. 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  CTA abdomen/pelvis: 1. Occasional colonic diverticula, several with hyperdense, inspissated contents. No intraluminal contrast extravasation or other findings to specifically localize source of GI bleeding. 2. Normal caliber of the abdominal aorta. No evidence of aneurysm, dissection, or other acute aortic pathology. Moderate mixed calcific atherosclerosis. 3. Prostatomegaly.  Assessment & Plan by Problem: Principal Problem:   Acute GI bleeding  Acute GI bleed Syncope Suspect syncope 2/2 to acute GI bleed, felt to be a diverticular bleed despite no clear evidence of this on CTA. Fortunately hemodynamics are stable, hypothermia has resolved. He has had recurrence of hematochezia since arrival to [facility] ED once, but subjectively feels much improved compared to prior to arrival.  Plan: -GI consulted, appreciate their recommendations -NPO for now pending GI recs -Check H&H now; would consider rechecking H&H and CTA if additional hematochezia, unless  recommended otherwise by GI -Trend CBC, transfuse for Hgb <7 or hemodynamic changes  Lactic acidosis Suspect in setting to acute GI bleed, hypovolemia. Has improved with IVF and PRBC resuscitation, still mildly elevated to 2.6. Plan: -Trend lactic acid through normalization  HTN Plan: -Will hold home antihypertensives given ongoing GI bleed and risk of hemodynamic instability  BPPH Plan: -Continue home tamsulosin once taking PO  HLD Plan: -Continue home atorvastatin once taking PO  Polysubstance abuse Last alcohol and cocaine consumption 01/23. Plan: -CIWA without ativan -Folic acid, multivitamin, thiamine supplementation  Dispo: Admit patient to Inpatient with expected length of stay greater than 2 midnights.  SignedChamp Mungo, DO 12/01/2023, 11:58 AM  After 5pm on weekdays and 1pm on weekends: On Call pager: [phone]

## 2023-12-01 NOTE — ED Provider Notes (Signed)
  Physical Exam  BP 117/70   Pulse 65   Temp (!) 95.3 F (35.2 C) (Rectal)   Resp 13   Ht 5\' 11"  (1.803 m)   Wt 79.4 kg   SpO2 100%   BMI 24.41 kg/m   Physical Exam  Procedures  Procedures  ED Course / MDM   Clinical Course as of 12/01/23 1135  Fri Dec 01, 2023  0606 Patient cold to the touch. Core temp 95 Degrees, bair hugger ordered, single unit emergency release blood ordered, patient critically ill, frank blood soaking EMS sheet, no active hemorrhage on rectal exam at time of rectal temp.  [RS]  0801 Patient in CT. Temperature improved, VSS.  [SU]  1133 10:00 - CT angio - no definite source of bleed. CT head, maxillo, c-spine negative.    [SU]  1134 Discussed patient's admission with [facility] resident who accepts for admission. GI was contacted (Amy Esterwood, PA-C) who will provide consultation.  [SU]    Clinical Course User Index [RS] Sponseller, Eugene Gavia, PA-C [SU] Elpidio Anis, PA-C   Medical Decision Making Amount and/or Complexity of Data Reviewed Labs: ordered. Radiology: ordered.  Risk Prescription drug management. Decision regarding hospitalization.   24 hours rectal bleeding with syncope Hypotensive, hypothermic Lactic acidosis - 4.0 Getting 1 unit Remote history of GI bleed  11:30 - Admit to [facility], GI consulting         Elpidio Anis, PA-C 12/01/23 1135    [place], [place], MD 12/02/23 [phone]

## 2023-12-02 ENCOUNTER — Encounter (HOSPITAL_COMMUNITY): Payer: Self-pay | Admitting: Internal Medicine

## 2023-12-02 ENCOUNTER — Encounter (HOSPITAL_COMMUNITY)
Admission: EM | Disposition: A | Discharge status: Home / Self Care | Payer: Self-pay | Source: Home / Self Care | Attending: Internal Medicine

## 2023-12-02 ENCOUNTER — Inpatient Hospital Stay (HOSPITAL_COMMUNITY): Payer: Medicaid Other | Admitting: Anesthesiology

## 2023-12-02 DIAGNOSIS — K222 Esophageal obstruction: Secondary | ICD-10-CM | POA: Diagnosis not present

## 2023-12-02 DIAGNOSIS — K640 First degree hemorrhoids: Secondary | ICD-10-CM

## 2023-12-02 DIAGNOSIS — K2980 Duodenitis without bleeding: Secondary | ICD-10-CM

## 2023-12-02 DIAGNOSIS — K315 Obstruction of duodenum: Secondary | ICD-10-CM

## 2023-12-02 DIAGNOSIS — K633 Ulcer of intestine: Secondary | ICD-10-CM

## 2023-12-02 DIAGNOSIS — D123 Benign neoplasm of transverse colon: Secondary | ICD-10-CM | POA: Diagnosis not present

## 2023-12-02 DIAGNOSIS — D62 Acute posthemorrhagic anemia: Secondary | ICD-10-CM

## 2023-12-02 DIAGNOSIS — K2970 Gastritis, unspecified, without bleeding: Secondary | ICD-10-CM | POA: Diagnosis not present

## 2023-12-02 DIAGNOSIS — K2950 Unspecified chronic gastritis without bleeding: Secondary | ICD-10-CM | POA: Diagnosis not present

## 2023-12-02 DIAGNOSIS — K5730 Diverticulosis of large intestine without perforation or abscess without bleeding: Secondary | ICD-10-CM

## 2023-12-02 DIAGNOSIS — K6289 Other specified diseases of anus and rectum: Secondary | ICD-10-CM | POA: Diagnosis not present

## 2023-12-02 DIAGNOSIS — K3189 Other diseases of stomach and duodenum: Secondary | ICD-10-CM | POA: Diagnosis not present

## 2023-12-02 DIAGNOSIS — B9681 Helicobacter pylori [H. pylori] as the cause of diseases classified elsewhere: Secondary | ICD-10-CM

## 2023-12-02 DIAGNOSIS — K648 Other hemorrhoids: Secondary | ICD-10-CM

## 2023-12-02 DIAGNOSIS — D125 Benign neoplasm of sigmoid colon: Secondary | ICD-10-CM | POA: Diagnosis not present

## 2023-12-02 DIAGNOSIS — D649 Anemia, unspecified: Secondary | ICD-10-CM

## 2023-12-02 HISTORY — PX: POLYPECTOMY: SHX5525

## 2023-12-02 HISTORY — PX: COLONOSCOPY WITH PROPOFOL: SHX5780

## 2023-12-02 HISTORY — PX: BIOPSY: SHX5522

## 2023-12-02 HISTORY — PX: HEMOSTASIS CLIP PLACEMENT: SHX6857

## 2023-12-02 HISTORY — PX: ESOPHAGOGASTRODUODENOSCOPY (EGD) WITH PROPOFOL: SHX5813

## 2023-12-02 LAB — COMPREHENSIVE METABOLIC PANEL
ALT: 16 U/L (ref 0–44)
AST: 21 U/L (ref 15–41)
Albumin: 3.1 g/dL — ABNORMAL LOW (ref 3.5–5.0)
Alkaline Phosphatase: 64 U/L (ref 38–126)
Anion gap: 10 (ref 5–15)
BUN: 5 mg/dL — ABNORMAL LOW (ref 8–23)
CO2: 25 mmol/L (ref 22–32)
Calcium: 8.6 mg/dL — ABNORMAL LOW (ref 8.9–10.3)
Chloride: 108 mmol/L (ref 98–111)
Creatinine, Ser: 0.87 mg/dL (ref 0.61–1.24)
GFR, Estimated: >60 mL/min (ref >60)
Glucose, Bld: 99 mg/dL (ref 70–99)
Potassium: 3.3 mmol/L — ABNORMAL LOW (ref 3.5–5.1)
Sodium: 143 mmol/L (ref 135–145)
Total Bilirubin: 0.9 mg/dL (ref 0.0–1.2)
Total Protein: 5.6 g/dL — ABNORMAL LOW (ref 6.5–8.1)

## 2023-12-02 LAB — CBC
HCT: 33.1 % — ABNORMAL LOW (ref 39.0–52.0)
Hemoglobin: 11 g/dL — ABNORMAL LOW (ref 13.0–17.0)
MCH: 31.4 pg (ref 26.0–34.0)
MCHC: 33.2 g/dL (ref 30.0–36.0)
MCV: 94.6 fL (ref 80.0–100.0)
Platelets: 175 10*3/uL (ref 150–400)
RBC: 3.5 MIL/uL — ABNORMAL LOW (ref 4.22–5.81)
RDW: 15.8 % — ABNORMAL HIGH (ref 11.5–15.5)
WBC: 5.6 10*3/uL (ref 4.0–10.5)
nRBC: 0 % (ref 0.0–0.2)

## 2023-12-02 LAB — TYPE AND SCREEN
ABO/RH(D): O POS
Antibody Screen: NEGATIVE
Unit division: 0

## 2023-12-02 LAB — HEMOGLOBIN AND HEMATOCRIT, BLOOD
HCT: 34.8 % — ABNORMAL LOW (ref 39.0–52.0)
Hemoglobin: 11.5 g/dL — ABNORMAL LOW (ref 13.0–17.0)

## 2023-12-02 LAB — BPAM RBC
Blood Product Expiration Date: 202502152359
ISSUE DATE / TIME: 202501240605
Unit Type and Rh: 5100

## 2023-12-02 LAB — MAGNESIUM: Magnesium: 2 mg/dL (ref 1.7–2.4)

## 2023-12-02 SURGERY — COLONOSCOPY WITH PROPOFOL
Anesthesia: Monitor Anesthesia Care

## 2023-12-02 MED ORDER — LIDOCAINE 2% (20 MG/ML) 5 ML SYRINGE
INTRAMUSCULAR | Status: DC | PRN
  Administered 2023-12-02: 60 mg via INTRAVENOUS
  Administered 2023-12-02: 40 mg via INTRAVENOUS

## 2023-12-02 MED ORDER — FOLIC ACID 5 MG/ML IJ SOLN
1.0000 mg | Freq: Every day | INTRAMUSCULAR | Status: DC
  Administered 2023-12-02 – 2023-12-03 (×2): 1 mg via INTRAVENOUS
  Filled 2023-12-02 (×2): qty 0.2

## 2023-12-02 MED ORDER — PROPOFOL 500 MG/50ML IV EMUL
INTRAVENOUS | Status: DC | PRN
  Administered 2023-12-02: 20 mg via INTRAVENOUS
  Administered 2023-12-02: 70 mg via INTRAVENOUS
  Administered 2023-12-02 (×2): 20 mg via INTRAVENOUS
  Administered 2023-12-02: 50 ug/kg/min via INTRAVENOUS
  Administered 2023-12-02: 30 mg via INTRAVENOUS
  Administered 2023-12-02: 20 mg via INTRAVENOUS

## 2023-12-02 MED ORDER — POTASSIUM CHLORIDE CRYS ER 20 MEQ PO TBCR
40.0000 meq | EXTENDED_RELEASE_TABLET | Freq: Once | ORAL | Status: AC
  Administered 2023-12-02: 40 meq via ORAL
  Filled 2023-12-02: qty 2

## 2023-12-02 MED ORDER — PHENYLEPHRINE 80 MCG/ML (10ML) SYRINGE FOR IV PUSH (FOR BLOOD PRESSURE SUPPORT)
PREFILLED_SYRINGE | INTRAVENOUS | Status: DC | PRN
  Administered 2023-12-02: 80 ug via INTRAVENOUS
  Administered 2023-12-02: 160 ug via INTRAVENOUS
  Administered 2023-12-02 (×5): 80 ug via INTRAVENOUS

## 2023-12-02 MED ORDER — ACETAMINOPHEN 325 MG PO TABS: 650.0000 mg | ORAL_TABLET | Freq: Four times a day (QID) | ORAL | Status: DC | PRN

## 2023-12-02 MED ORDER — SODIUM CHLORIDE 0.9 % IV SOLN: INTRAVENOUS | Status: DC | PRN

## 2023-12-02 SURGICAL SUPPLY — 24 items
BLOCK BITE 60FR ADLT L/F BLUE (MISCELLANEOUS) ×2 IMPLANT
ELECT REM PT RETURN 9FT ADLT (ELECTROSURGICAL)
ELECTRODE REM PT RTRN 9FT ADLT (ELECTROSURGICAL) IMPLANT
FCP BXJMBJMB 240X2.8X (CUTTING FORCEPS)
FLOOR PAD 36X40 (MISCELLANEOUS) ×2
FORCEP RJ3 GP 1.8X160 W-NEEDLE (CUTTING FORCEPS) IMPLANT
FORCEPS BIOP RAD 4 LRG CAP 4 (CUTTING FORCEPS) IMPLANT
FORCEPS BIOP RJ4 240 W/NDL (CUTTING FORCEPS)
FORCEPS BXJMBJMB 240X2.8X (CUTTING FORCEPS) IMPLANT
INJECTOR/SNARE I SNARE (MISCELLANEOUS) IMPLANT
LUBRICANT JELLY 4.5OZ STERILE (MISCELLANEOUS) IMPLANT
MANIFOLD NEPTUNE II (INSTRUMENTS) IMPLANT
NDL SCLEROTHERAPY 25GX240 (NEEDLE) IMPLANT
NEEDLE SCLEROTHERAPY 25GX240 (NEEDLE) IMPLANT
PAD FLOOR 36X40 (MISCELLANEOUS) ×2 IMPLANT
PROBE APC STR FIRE (PROBE) IMPLANT
PROBE INJECTION GOLD 7FR (MISCELLANEOUS) IMPLANT
SNARE ROTATE MED OVAL 20MM (MISCELLANEOUS) IMPLANT
SNARE SHORT THROW 13M SML OVAL (MISCELLANEOUS) IMPLANT
SYR 50ML LL SCALE MARK (SYRINGE) IMPLANT
TRAP SPECIMEN MUCOUS 40CC (MISCELLANEOUS) IMPLANT
TUBING ENDO SMARTCAP PENTAX (MISCELLANEOUS) ×4 IMPLANT
TUBING IRRIGATION ENDOGATOR (MISCELLANEOUS) ×2 IMPLANT
WATER STERILE IRR 1000ML POUR (IV SOLUTION) IMPLANT

## 2023-12-02 NOTE — Progress Notes (Signed)
HD#1 Subjective:   Summary: Kyle Zamora is a 63 y.o. male with PMH of HTN, BPH who presents with BRBPR and admitted for GI bleed.  Overnight Events: None  Patient evaluated at bedside.  Denies any dizziness or lightheadedness.  Ambulated well last night.  Was able to complete bowel prep and did not see any new bright red blood.  Patient aware of EGD and colonoscopy today.  Objective:  Vital signs in last 24 hours: Vitals:   12/02/23 0050 12/02/23 0452 12/02/23 0753 12/02/23 1055  BP: 133/76 127/72 (!) 140/80 123/75  Pulse: 78 68 65 74  Resp: 18 18 18  (!) 22  Temp: 98.5 F (36.9 C) 98.9 F (37.2 C) 98.1 F (36.7 C) 98 F (36.7 C)  TempSrc: Oral Oral  Temporal  SpO2: 100% 100% 100% 97%  Weight:      Height:       Supplemental O2: Room Air SpO2: 100 %   Physical Exam:  Constitutional: well-appearing male laying in bed comfortably, in no acute distress Cardiovascular: regular rate and rhythm Pulmonary/Chest: normal work of breathing on room air Abdominal: Bowel sounds present, soft, non-tender, non-distended Neurological: alert & oriented x 3  Filed Weights   12/01/23 0601  Weight: 79.4 kg     Intake/Output Summary (Last 24 hours) at 12/02/2023 0620 Last data filed at 12/01/2023 1345 Gross per 24 hour  Intake 1425 ml  Output 1155 ml  Net 270 ml   Net IO Since Admission: 270 mL [12/02/23 0620]  Pertinent Labs:    Latest Ref Rng & Units 12/02/2023   12:41 AM 12/01/2023    8:05 PM 12/01/2023   12:19 PM  CBC  Hemoglobin 13.0 - 17.0 g/dL 40.9  81.1  91.4   Hematocrit 39.0 - 52.0 % 34.8  32.2  33.8        Latest Ref Rng & Units 12/01/2023    6:13 AM 10/16/2023    3:54 PM 03/29/2023   11:22 AM  CMP  Glucose 70 - 99 mg/dL 782  94  956   BUN 8 - 23 mg/dL 7  8  10    Creatinine 0.61 - 1.24 mg/dL 2.13  0.86  5.78   Sodium 135 - 145 mmol/L 140  141  141   Potassium 3.5 - 5.1 mmol/L 3.5  4.1  4.5   Chloride 98 - 111 mmol/L 106  103  101   CO2 22 - 32 mmol/L  22  25  26    Calcium 8.9 - 10.3 mg/dL 8.5  9.6  9.4   Total Protein 6.5 - 8.1 g/dL 5.7   6.1   Total Bilirubin 0.0 - 1.2 mg/dL 0.7   0.3   Alkaline Phos 38 - 126 U/L 60   87   AST 15 - 41 U/L 17   19   ALT 0 - 44 U/L 15   20     Imaging: CT Head Wo Contrast Result Date: 12/01/2023 CLINICAL DATA:  Head trauma, moderate-severe; Neck trauma, dangerous injury mechanism (Age 43-64y); Facial trauma, blunt. Syncopal episode. Injury to the left side of the head. Neck pain. Right arm pain and paresthesias. EXAM: CT HEAD WITHOUT CONTRAST CT MAXILLOFACIAL WITHOUT CONTRAST CT CERVICAL SPINE WITHOUT CONTRAST TECHNIQUE: Multidetector CT imaging of the head, cervical spine, and maxillofacial structures were performed using the standard protocol without intravenous contrast. Multiplanar CT image reconstructions of the cervical spine and maxillofacial structures were also generated. RADIATION DOSE REDUCTION: This exam was performed according  to the departmental dose-optimization program which includes automated exposure control, adjustment of the mA and/or kV according to patient size and/or use of iterative reconstruction technique. COMPARISON:  None Available. FINDINGS: CT HEAD FINDINGS Brain: There is no evidence of an acute infarct, intracranial hemorrhage, mass, midline shift, or extra-axial fluid collection. The ventricles are normal in size with normal cerebral volume. Vascular: Calcified atherosclerosis at the skull base. No hyperdense vessel. Skull: No acute fracture or suspicious osseous lesion. Other: None. CT MAXILLOFACIAL FINDINGS Osseous: No acute fracture, suspicious osseous lesion, or mandibular dislocation. Absent maxillary dentition. Orbits: Unremarkable. Sinuses: Very mild scattered mucosal thickening throughout the paranasal sinuses. Clear mastoid air cells and middle ear cavities. Presume cerumen in the external auditory canals. Soft tissues: Unremarkable. CT CERVICAL SPINE FINDINGS Alignment:  Cervical spine straightening.  No significant listhesis. Skull base and vertebrae: No acute fracture or suspicious osseous lesion. Soft tissues and spinal canal: No prevertebral fluid or swelling. No visible canal hematoma. Disc levels: Disc degeneration from C3-4 through C6-7 including up to moderate disc space narrowing and prominent degenerative endplate irregularity and sclerosis. Suspected moderate spinal stenosis at these levels. Asymmetrically advanced neural foraminal stenosis on the left at C3-4 and on the right at C4-5 and C5-6. Upper chest: Emphysema and scarring in the included lung apices. Other: None. IMPRESSION: 1. No evidence of acute intracranial abnormality. 2. No acute maxillofacial or cervical spine fracture. 3. 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 Electronically Signed   By: Sebastian Ache M.D.   On: 12/01/2023 09:55   CT Cervical Spine Wo Contrast Result Date: 12/01/2023 CLINICAL DATA:  Head trauma, moderate-severe; Neck trauma, dangerous injury mechanism (Age 36-64y); Facial trauma, blunt. Syncopal episode. Injury to the left side of the head. Neck pain. Right arm pain and paresthesias. EXAM: CT HEAD WITHOUT CONTRAST CT MAXILLOFACIAL WITHOUT CONTRAST CT CERVICAL SPINE WITHOUT CONTRAST TECHNIQUE: Multidetector CT imaging of the head, cervical spine, and maxillofacial structures were performed using the standard protocol without intravenous contrast. Multiplanar CT image reconstructions of the cervical spine and maxillofacial structures were also generated. RADIATION DOSE REDUCTION: This exam was performed according to the departmental dose-optimization program which includes automated exposure control, adjustment of the mA and/or kV according to patient size and/or use of iterative reconstruction technique. COMPARISON:  None Available. FINDINGS: CT HEAD FINDINGS Brain: There is no evidence of an acute  infarct, intracranial hemorrhage, mass, midline shift, or extra-axial fluid collection. The ventricles are normal in size with normal cerebral volume. Vascular: Calcified atherosclerosis at the skull base. No hyperdense vessel. Skull: No acute fracture or suspicious osseous lesion. Other: None. CT MAXILLOFACIAL FINDINGS Osseous: No acute fracture, suspicious osseous lesion, or mandibular dislocation. Absent maxillary dentition. Orbits: Unremarkable. Sinuses: Very mild scattered mucosal thickening throughout the paranasal sinuses. Clear mastoid air cells and middle ear cavities. Presume cerumen in the external auditory canals. Soft tissues: Unremarkable. CT CERVICAL SPINE FINDINGS Alignment: Cervical spine straightening.  No significant listhesis. Skull base and vertebrae: No acute fracture or suspicious osseous lesion. Soft tissues and spinal canal: No prevertebral fluid or swelling. No visible canal hematoma. Disc levels: Disc degeneration from C3-4 through C6-7 including up to moderate disc space narrowing and prominent degenerative endplate irregularity and sclerosis. Suspected moderate spinal stenosis at these levels. Asymmetrically advanced neural foraminal stenosis on the left at C3-4 and on the right at C4-5 and C5-6. Upper chest: Emphysema and scarring in the included lung apices. Other: None. IMPRESSION: 1. No  evidence of acute intracranial abnormality. 2. No acute maxillofacial or cervical spine fracture. 3. 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 Electronically Signed   By: Sebastian Ache M.D.   On: 12/01/2023 09:55   CT Maxillofacial Wo Contrast Result Date: 12/01/2023 CLINICAL DATA:  Head trauma, moderate-severe; Neck trauma, dangerous injury mechanism (Age 14-64y); Facial trauma, blunt. Syncopal episode. Injury to the left side of the head. Neck pain. Right arm pain and paresthesias. EXAM: CT HEAD WITHOUT CONTRAST  CT MAXILLOFACIAL WITHOUT CONTRAST CT CERVICAL SPINE WITHOUT CONTRAST TECHNIQUE: Multidetector CT imaging of the head, cervical spine, and maxillofacial structures were performed using the standard protocol without intravenous contrast. Multiplanar CT image reconstructions of the cervical spine and maxillofacial structures were also generated. RADIATION DOSE REDUCTION: This exam was performed according to the departmental dose-optimization program which includes automated exposure control, adjustment of the mA and/or kV according to patient size and/or use of iterative reconstruction technique. COMPARISON:  None Available. FINDINGS: CT HEAD FINDINGS Brain: There is no evidence of an acute infarct, intracranial hemorrhage, mass, midline shift, or extra-axial fluid collection. The ventricles are normal in size with normal cerebral volume. Vascular: Calcified atherosclerosis at the skull base. No hyperdense vessel. Skull: No acute fracture or suspicious osseous lesion. Other: None. CT MAXILLOFACIAL FINDINGS Osseous: No acute fracture, suspicious osseous lesion, or mandibular dislocation. Absent maxillary dentition. Orbits: Unremarkable. Sinuses: Very mild scattered mucosal thickening throughout the paranasal sinuses. Clear mastoid air cells and middle ear cavities. Presume cerumen in the external auditory canals. Soft tissues: Unremarkable. CT CERVICAL SPINE FINDINGS Alignment: Cervical spine straightening.  No significant listhesis. Skull base and vertebrae: No acute fracture or suspicious osseous lesion. Soft tissues and spinal canal: No prevertebral fluid or swelling. No visible canal hematoma. Disc levels: Disc degeneration from C3-4 through C6-7 including up to moderate disc space narrowing and prominent degenerative endplate irregularity and sclerosis. Suspected moderate spinal stenosis at these levels. Asymmetrically advanced neural foraminal stenosis on the left at C3-4 and on the right at C4-5 and C5-6. Upper  chest: Emphysema and scarring in the included lung apices. Other: None. IMPRESSION: 1. No evidence of acute intracranial abnormality. 2. No acute maxillofacial or cervical spine fracture. 3. 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 Electronically Signed   By: Sebastian Ache M.D.   On: 12/01/2023 09:55   CT ANGIO GI BLEED Result Date: 12/01/2023 CLINICAL DATA:  GI bleed, witnessed syncopal episode, rectal bleeding since yesterday, ETOH use EXAM: CTA ABDOMEN AND PELVIS WITHOUT AND WITH CONTRAST TECHNIQUE: Multidetector CT imaging of the abdomen and pelvis was performed using the standard protocol during bolus administration of intravenous contrast. Multiplanar reconstructed images and MIPs were obtained and reviewed to evaluate the vascular anatomy. RADIATION DOSE REDUCTION: This exam was performed according to the departmental dose-optimization program which includes automated exposure control, adjustment of the mA and/or kV according to patient size and/or use of iterative reconstruction technique. CONTRAST:  75mL OMNIPAQUE IOHEXOL 350 MG/ML SOLN COMPARISON:  None Available. FINDINGS: VASCULAR Normal caliber of the abdominal aorta. No evidence of aneurysm, dissection, or other acute aortic pathology. 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 Moderate mixed calcific atherosclerosis. Review of the MIP images confirms the above findings. NON-VASCULAR Lower Chest: No acute findings. Hepatobiliary: No solid liver abnormality is  seen. No gallstones, gallbladder wall thickening, or biliary dilatation. Pancreas: Unremarkable. No pancreatic ductal dilatation or surrounding inflammatory changes. Spleen: Normal in size without significant abnormality. Adrenals/Urinary Tract: Adrenal glands are  unremarkable. Kidneys are normal, without renal calculi, solid lesion, or hydronephrosis. Bladder is unremarkable. Stomach/Bowel: Stomach is within normal limits. Appendix appears normal. No evidence of bowel wall thickening, distention, or inflammatory changes. Occasional colonic diverticula, several with hyperdense, inspissated contents. No intraluminal contrast extravasation or other findings to specifically localize source of GI bleeding. Lymphatic: No enlarged abdominal or pelvic lymph nodes. Reproductive: Prostatomegaly. Other: No abdominal wall hernia or abnormality. No ascites. Musculoskeletal: No acute osseous findings. IMPRESSION: 1. Occasional colonic diverticula, several with hyperdense, inspissated contents. No intraluminal contrast extravasation or other findings to specifically localize source of GI bleeding. 2. Normal caliber of the abdominal aorta. No evidence of aneurysm, dissection, or other acute aortic pathology. Moderate mixed calcific atherosclerosis. 3. Prostatomegaly. Aortic Atherosclerosis (ICD10-I70.0). Electronically Signed   By: Jearld Lesch M.D.   On: 12/01/2023 09:36   DG Chest Portable 1 View Result Date: 12/01/2023 CLINICAL DATA:  63 year old male status post fall. EXAM: PORTABLE CHEST 1 VIEW COMPARISON:  12/09/2019 chest radiographs. FINDINGS: Portable AP supine view at 0631 hours. Lung volumes and mediastinal contours are stable and within normal limits. Visualized tracheal air column is within normal limits. Allowing for portable technique the lungs are clear. No pneumothorax or pleural effusion identified on this supine view. No acute osseous abnormality identified. IMPRESSION: No acute cardiopulmonary abnormality or acute traumatic injury identified. Electronically Signed   By: Odessa Fleming M.D.   On: 12/01/2023 06:42    Assessment/Plan:   Principal Problem:   Acute GI bleeding   Patient Summary: Kyle Zamora is a 63 y.o. with a pertinent PMH of HTN, BPH who presents  with BRBPR and admitted for GI bleed.  Acute GI bleed Syncope  Stable. Hemoglobin has remained stable around 11.  Baseline from 03/2023 at 14.  Patient does not report any new bright red blood per rectum. Noted diverticulosis on CT angio but no contrast extravasation. Plan for EGD and colonoscopy today, NPO at this time.  -Appreciate GI assistance, follow-up on EGD/colonoscopy results -Continue IV Protonix 40 twice daily -Restart diet after procedure -Trend CBC -Transfuse if hemoglobin less than 7  Hypokalemia K 3.3 today. Pending magnesium. Will replete as needed.  -Replete K -Trend BMP  HTN Holding home antihypertensives including amlodipine 5 mg and HCTZ 25 mg.  BP has remained normotensive mostly.  Plan to resume home antihypertensives when indicated.  Polysubstance use Last alcohol and cocaine consumption on 1/23. CIWA score 0. Continue cessation counseling.  -CIWA without Ativan -Continue with folate acid, multivitamin, thiamine  Chronic conditions: -BPH: Continue home Flomax 0.4 mg daily -HLD: Continue home atorvastatin 20 mg daily   Diet: NPO (restart diet after procedure) IVF: None,None VTE: SCDs Start: 12/01/23 1144 Code: Full PT/OT recs: N/A TOC recs: N/A Family Update: son at bedside    Dispo: Anticipated discharge to Home in 1-2 days pending EGD/colonoscopy and medical stability.   Rana Snare, DO Internal Medicine Resident PGY-2 Pager: [phone] Please contact the on call pager after 5 pm and on weekends at [phone].

## 2023-12-02 NOTE — Interval H&P Note (Signed)
History and Physical Interval Note:  No acute events overnight.  H/H stable at 11/33.  CTA with diverticulosis, but no extravasation.  Completed bowel prep o/n with plan for EGD and colonoscopy today for diagnostic and therapeutic intent.  12/02/2023 10:43 AM  Kyle Zamora  has presented today for surgery, with the diagnosis of Acute GI bleed.  The various methods of treatment have been discussed with the patient and family. After consideration of risks, benefits and other options for treatment, the patient has consented to  Procedure(s): COLONOSCOPY WITH PROPOFOL (N/A) ESOPHAGOGASTRODUODENOSCOPY (EGD) WITH PROPOFOL (N/A) as a surgical intervention.  The patient's history has been reviewed, patient examined, no change in status, stable for surgery.  I have reviewed the patient's chart and labs.  Questions were answered to the patient's satisfaction.     Kyle Zamora

## 2023-12-02 NOTE — Transfer of Care (Signed)
Immediate Anesthesia Transfer of Care Note  Patient: Kyle Zamora  Procedure(s) Performed: COLONOSCOPY WITH PROPOFOL ESOPHAGOGASTRODUODENOSCOPY (EGD) WITH PROPOFOL BIOPSY POLYPECTOMY HEMOSTASIS CLIP PLACEMENT  Patient Location: PACU  Anesthesia Type:MAC  Level of Consciousness: awake, alert , oriented, and patient cooperative  Airway & Oxygen Therapy: Patient Spontanous Breathing  Post-op Assessment: Report given to RN, Post -op Vital signs reviewed and stable, and Patient moving all extremities  Post vital signs: Reviewed and stable  Last Vitals:  Vitals Value Taken Time  BP 115/75 12/02/23 1148  Temp 36.6 C 12/02/23 1148  Pulse 74 12/02/23 1149  Resp 28 12/02/23 1149  SpO2 100 % 12/02/23 1149  Vitals shown include unfiled device data.  Last Pain:  Vitals:   12/02/23 1055  TempSrc: Temporal  PainSc: 0-No pain         Complications: No notable events documented.

## 2023-12-02 NOTE — Anesthesia Postprocedure Evaluation (Signed)
Anesthesia Post Note  Patient: Kyle Zamora  Procedure(s) Performed: COLONOSCOPY WITH PROPOFOL ESOPHAGOGASTRODUODENOSCOPY (EGD) WITH PROPOFOL BIOPSY POLYPECTOMY HEMOSTASIS CLIP PLACEMENT     Patient location during evaluation: Endoscopy Anesthesia Type: MAC Level of consciousness: oriented, awake and alert and awake Pain management: pain level controlled Vital Signs Assessment: post-procedure vital signs reviewed and stable Respiratory status: spontaneous breathing, nonlabored ventilation and respiratory function stable Cardiovascular status: blood pressure returned to baseline and stable Postop Assessment: no headache, no backache and no apparent nausea or vomiting Anesthetic complications: no   No notable events documented.  Last Vitals:  Vitals:   12/02/23 1200 12/02/23 1215  BP: 129/82 127/82  Pulse: (!) 106 66  Resp: 14 18  Temp:  37 C  SpO2: 99% 100%    Last Pain:  Vitals:   12/02/23 1148  TempSrc:   PainSc: 0-No pain                 Collene Schlichter

## 2023-12-02 NOTE — Op Note (Signed)
[facility] Patient Name: Kyle Zamora Procedure Date : 12/02/2023 MRN: [id number] Attending MD: Doristine Locks , MD, [id number] Date of Birth: [birth year] CSN: [id number] Age: 63 Admit Type: Inpatient Procedure:                Upper GI endoscopy Indications:              Acute post hemorrhagic anemia, Hematochezia,                            Symptomatic anemia Providers:                Eliberto Ivory, RN, Marja Kays, Technician, Doristine Locks, MD Referring MD:              Medicines:                Monitored Anesthesia Care Complications:            No immediate complications. Estimated Blood Loss:     Estimated blood loss was minimal. Procedure:                Pre-Anesthesia Assessment:                           - Prior to the procedure, a History and Physical                            was performed, and patient medications and                            allergies were reviewed. The patient's tolerance of                            previous anesthesia was also reviewed. The risks                            and benefits of the procedure and the sedation                            options and risks were discussed with the patient.                            All questions were answered, and informed consent                            was obtained. Prior Anticoagulants: The patient has                            taken no anticoagulant or antiplatelet agents. ASA                            Grade Assessment: III - A patient with severe  systemic disease. After reviewing the risks and                            benefits, the patient was deemed in satisfactory                            condition to undergo the procedure.                           After obtaining informed consent, the endoscope was                            passed under direct vision. Throughout the                            procedure, the patient's  blood pressure, pulse, and                            oxygen saturations were monitored continuously. The                            [id number]-[id number] ([id number]) Olympus endoscope was introduced                            through the mouth, and advanced to the third part                            of duodenum. The upper GI endoscopy was                            accomplished without difficulty. The patient                            tolerated the procedure well. Scope In: Scope Out: Findings:      A non-obstructing Schatzki ring was found in the lower third of the       esophagus.      The examined esophagus was otherwise normal.      Diffuse mild inflammation characterized by congestion (edema) was found       in the gastric fundus, in the gastric body and in the gastric antrum.       Biopsies were taken with a cold forceps for histology. Estimated blood       loss was minimal.      Localized mildly erythematous mucosa without active bleeding was found       in the duodenal bulb.      An acquired benign-appearing, intrinsic mild stenosis was found in the       second portion of the duodenum and was traversed. There were 2 small,       superficial, non-bleeding ulcers associated with the stenosis. No high       grade stigmata of bleeding to warrant endoscopic intervention. Biopsies       were taken with a cold forceps for histology. Estimated blood loss was       minimal.      The third portion of the duodenum was normal. Impression:               -  Non-obstructing Schatzki ring.                           - Normal esophagus.                           - Mild, non-ulcer gastritis. Biopsied.                           - Erythematous duodenopathy.                           - Acquired duodenal stenosis with ulceration.                            Biopsied.                           - Normal third portion of the duodenum. Recommendation:           - Use Protonix (pantoprazole) 40 mg PO BID for 8                             weeks, then reduce to 40 mg daily.                           - No aspirin, ibuprofen, naproxen, or other                            non-steroidal anti-inflammatory drugs.                           - Await pathology results.                           - Perform a colonoscopy today.                           - Recommend repeat EGD in 8 weeks to evaluate for                            appropriate mucosal healing. Procedure Code(s):        --- Professional ---                           [phone], Esophagogastroduodenoscopy, flexible,                            transoral; with biopsy, single or multiple Diagnosis Code(s):        --- Professional ---                           K22.2, Esophageal obstruction                           K29.70, Gastritis, unspecified, without bleeding  K31.89, Other diseases of stomach and duodenum                           K31.5, Obstruction of duodenum                           D62, Acute posthemorrhagic anemia                           K92.1, Melena (includes Hematochezia) CPT copyright 2022 [employer]. All rights reserved. The codes documented in this report are preliminary and upon coder review may  be revised to meet current compliance requirements. Doristine Locks, MD 12/02/2023 12:14:39 PM Number of Addenda: 0

## 2023-12-02 NOTE — Plan of Care (Signed)

## 2023-12-02 NOTE — Anesthesia Preprocedure Evaluation (Signed)
Anesthesia Evaluation  Patient identified by MRN, date of birth, ID band Patient awake    Reviewed: Allergy & Precautions, NPO status , Patient's Chart, lab work & pertinent test results  Airway Mallampati: II  TM Distance: >3 FB Neck ROM: Full    Dental  (+) Dental Advisory Given, Edentulous Upper   Pulmonary Current Smoker and Patient abstained from smoking.   Pulmonary exam normal breath sounds clear to auscultation       Cardiovascular hypertension, Pt. on medications Normal cardiovascular exam Rhythm:Regular Rate:Normal     Neuro/Psych negative neurological ROS  negative psych ROS   GI/Hepatic Neg liver ROS,,,Acute GI bleed   Endo/Other  negative endocrine ROS    Renal/GU negative Renal ROS     Musculoskeletal negative musculoskeletal ROS (+)    Abdominal   Peds  Hematology  (+) Blood dyscrasia, anemia   Anesthesia Other Findings Day of surgery medications reviewed with the patient.  Reproductive/Obstetrics                             Anesthesia Physical Anesthesia Plan  ASA: 3  Anesthesia Plan: MAC   Post-op Pain Management: Minimal or no pain anticipated   Induction: Intravenous  PONV Risk Score and Plan: 0 and TIVA and Treatment may vary due to age or medical condition  Airway Management Planned: Natural Airway and Simple Face Mask  Additional Equipment:   Intra-op Plan:   Post-operative Plan:   Informed Consent: I have reviewed the patients History and Physical, chart, labs and discussed the procedure including the risks, benefits and alternatives for the proposed anesthesia with the patient or authorized representative who has indicated his/her understanding and acceptance.     Dental advisory given  Plan Discussed with: CRNA  Anesthesia Plan Comments:        Anesthesia Quick Evaluation

## 2023-12-02 NOTE — Op Note (Signed)
[facility] Patient Name: Kyle Zamora Procedure Date : 12/02/2023 MRN: [id number] Attending MD: Doristine Locks , MD, [id number] Date of Birth: 1961/07/27 CSN: [id number] Age: 63 Admit Type: Inpatient Procedure:                Colonoscopy Indications:              Hematochezia, Acute post hemorrhagic anemia Providers:                Eliberto Ivory, RN, Marja Kays, Technician, Doristine Locks, MD Referring MD:              Medicines:                Monitored Anesthesia Care Complications:            No immediate complications. Estimated Blood Loss:     Estimated blood loss was minimal. Procedure:                Pre-Anesthesia Assessment:                           - Prior to the procedure, a History and Physical                            was performed, and patient medications and                            allergies were reviewed. The patient's tolerance of                            previous anesthesia was also reviewed. The risks                            and benefits of the procedure and the sedation                            options and risks were discussed with the patient.                            All questions were answered, and informed consent                            was obtained. Prior Anticoagulants: The patient has                            taken no anticoagulant or antiplatelet agents. ASA                            Grade Assessment: III - A patient with severe                            systemic disease. After reviewing the risks and  benefits, the patient was deemed in satisfactory                            condition to undergo the procedure.                           After obtaining informed consent, the colonoscope                            was passed under direct vision. Throughout the                            procedure, the patient's blood pressure, pulse, and                             oxygen saturations were monitored continuously. The                            CF-HQ190L ([id number]) Olympus coloscope was                            introduced through the anus and advanced to the the                            terminal ileum. The colonoscopy was performed                            without difficulty. The patient tolerated the                            procedure well. The quality of the bowel                            preparation was good. The terminal ileum, ileocecal                            valve, appendiceal orifice, and rectum were                            photographed. Scope In: 11:21:06 AM Scope Out: 11:39:36 AM Scope Withdrawal Time: 0 hours 15 minutes 44 seconds  Total Procedure Duration: 0 hours 18 minutes 30 seconds  Findings:      The perianal and digital rectal examinations were normal.      Multiple medium-mouthed and small-mouthed diverticula were found in the       sigmoid colon, descending colon and ascending colon. There was a small       ulcer adjacent to one of the diverticula in the ascending colon. For       hemostasis, one hemostatic clip was successfully placed (MR       conditional). Clip manufacturer: [employer]. There was no       bleeding at the end of the procedure.      A 5 mm polyp was found in the transverse colon. The polyp was sessile.       The polyp was removed with a cold  snare. Resection and retrieval were       complete. Estimated blood loss was minimal.      Two sessile polyps were found in the sigmoid colon. The polyps were 3 to       5 mm in size. These polyps were removed with a cold snare. Resection and       retrieval were complete. Estimated blood loss was minimal.      Anal papilla(e) were hypertrophied.      Non-bleeding internal hemorrhoids were found during retroflexion. The       hemorrhoids were small.      The terminal ileum appeared normal. Impression:               - Diverticulosis in the sigmoid  colon, in the                            descending colon and in the ascending colon. Clip                            (MR conditional) was placed. Clip manufacturer:                            [employer].                           - One 5 mm polyp in the transverse colon, removed                            with a cold snare. Resected and retrieved.                           - Two 3 to 5 mm polyps in the sigmoid colon,                            removed with a cold snare. Resected and retrieved.                           - Anal papilla(e) were hypertrophied.                           - Non-bleeding internal hemorrhoids.                           - The examined portion of the ileum was normal. Recommendation:           - Return patient to hospital ward for ongoing care.                           - Advance diet as tolerated.                           - Continue present medications.                           - Await pathology results.                           -  Repeat colonoscopy for surveillance based on                            pathology results.                           - Return to GI clinic at appointment to be                            scheduled. Procedure Code(s):        --- Professional ---                           [id number], 59, Colonoscopy, flexible; with control of                            bleeding, any method                           45385, Colonoscopy, flexible; with removal of                            tumor(s), polyp(s), or other lesion(s) by snare                            technique Diagnosis Code(s):        --- Professional ---                           K64.8, Other hemorrhoids                           D12.3, Benign neoplasm of transverse colon (hepatic                            flexure or splenic flexure)                           D12.5, Benign neoplasm of sigmoid colon                           K62.89, Other specified diseases of anus and rectum                            K92.1, Melena (includes Hematochezia)                           D62, Acute posthemorrhagic anemia                           K57.30, Diverticulosis of large intestine without                            perforation or abscess without bleeding CPT copyright 2022 [employer]. All rights reserved. The codes documented in this report are preliminary and upon coder review may  be revised to meet current compliance requirements. Doristine Locks, MD 12/02/2023 12:21:40 PM Number  of Addenda: 0

## 2023-12-02 NOTE — Addendum Note (Signed)
Addendum  created 12/02/23 1311 by Colbert Coyer, CRNA   Flowsheet accepted

## 2023-12-03 LAB — CBC
HCT: 31.9 % — ABNORMAL LOW (ref 39.0–52.0)
Hemoglobin: 10.5 g/dL — ABNORMAL LOW (ref 13.0–17.0)
MCH: 31.1 pg (ref 26.0–34.0)
MCHC: 32.9 g/dL (ref 30.0–36.0)
MCV: 94.4 fL (ref 80.0–100.0)
Platelets: 170 10*3/uL (ref 150–400)
RBC: 3.38 MIL/uL — ABNORMAL LOW (ref 4.22–5.81)
RDW: 15.4 % (ref 11.5–15.5)
WBC: 7.7 10*3/uL (ref 4.0–10.5)
nRBC: 0 % (ref 0.0–0.2)

## 2023-12-03 LAB — BASIC METABOLIC PANEL
Anion gap: 8 (ref 5–15)
BUN: <5 mg/dL — ABNORMAL LOW (ref 8–23)
CO2: 24 mmol/L (ref 22–32)
Calcium: 8.5 mg/dL — ABNORMAL LOW (ref 8.9–10.3)
Chloride: 109 mmol/L (ref 98–111)
Creatinine, Ser: 0.76 mg/dL (ref 0.61–1.24)
GFR, Estimated: >60 mL/min (ref >60)
Glucose, Bld: 106 mg/dL — ABNORMAL HIGH (ref 70–99)
Potassium: 3.6 mmol/L (ref 3.5–5.1)
Sodium: 141 mmol/L (ref 135–145)

## 2023-12-03 MED ORDER — PANTOPRAZOLE SODIUM 40 MG PO TBEC: 40.0000 mg | DELAYED_RELEASE_TABLET | Freq: Two times a day (BID) | ORAL | 0 refills | Status: DC

## 2023-12-03 MED ORDER — PANTOPRAZOLE SODIUM 40 MG PO TBEC
40.0000 mg | DELAYED_RELEASE_TABLET | Freq: Two times a day (BID) | ORAL | Status: DC
  Administered 2023-12-03: 40 mg via ORAL
  Filled 2023-12-03: qty 1

## 2023-12-03 NOTE — Plan of Care (Signed)
  Problem: Education: Goal: Knowledge of General Education information will improve Description: Including pain rating scale, medication(s)/side effects and non-pharmacologic comfort measures Outcome: Progressing   Problem: Clinical Measurements: Goal: Ability to maintain clinical measurements within normal limits will improve Outcome: Progressing Goal: Diagnostic test results will improve Outcome: Progressing Goal: Respiratory complications will improve Outcome: Progressing Goal: Cardiovascular complication will be avoided Outcome: Progressing   Problem: Activity: Goal: Risk for activity intolerance will decrease Outcome: Progressing   Problem: Nutrition: Goal: Adequate nutrition will be maintained Outcome: Progressing   Problem: Coping: Goal: Level of anxiety will decrease Outcome: Progressing   Problem: Safety: Goal: Ability to remain free from injury will improve Outcome: Progressing

## 2023-12-03 NOTE — Discharge Summary (Cosign Needed Addendum)
Name: Kyle Zamora MRN: [id number] DOB: [birth year] 63 y.o. PCP: Olegario Messier, MD  Date of Admission: 12/01/2023  5:42 AM Date of Discharge: 12/03/2023 Attending Physician: Dr. Mayford Knife  Discharge Diagnosis: Principal Problem:   Acute GI bleeding Active Problems:   Acute blood loss anemia   Symptomatic anemia   Diverticulosis of colon without hemorrhage   Adenomatous polyp of transverse colon   Adenomatous polyp of sigmoid colon   Grade I internal hemorrhoids   Duodenal stricture   Duodenitis    Discharge Medications: Allergies as of 12/03/2023   No Known Allergies      Medication List     PAUSE taking these medications    amLODipine 5 MG tablet Wait to take this until your doctor or other care provider tells you to start again. Commonly known as: NORVASC Take 1 tablet (5 mg total) by mouth daily.   hydrochlorothiazide 25 MG tablet Wait to take this until your doctor or other care provider tells you to start again. Commonly known as: HYDRODIURIL Take 1 tablet (25 mg total) by mouth daily.       TAKE these medications    atorvastatin 20 MG tablet Commonly known as: Lipitor Take 1 tablet (20 mg total) by mouth daily.   diclofenac Sodium 1 % Gel Commonly known as: Voltaren Arthritis Pain Apply 2 g topically 4 (four) times daily.   pantoprazole 40 MG tablet Commonly known as: PROTONIX Take 1 tablet (40 mg total) by mouth 2 (two) times daily.   tamsulosin 0.4 MG Caps capsule Commonly known as: FLOMAX Take 1 capsule (0.4 mg total) by mouth daily.        Disposition and follow-up:   Mr.Pearley E Nicoson was discharged from [facility] in [place] condition.  At the hospital follow up visit please address:  1.  Follow-up:  a. Acute GI bleed.  Normal CTA, EGD showed gastritis/gastric stenosis with ulcer, and colonoscopy showed an ulcer adjacent to one of the diverticula in the ascending colon, likely the cause of BRBPR.  There were also  sessile polyp in the transverse/sigmoid colon that were clipped. Hb 10.5, discharged on pantoprazole 40 mg twice a day.  - Please assess if patient is having any more bleeding, and also taking the PPI. - He will need to continue PPI 40 mg twice a day for 30 days, then 40 mg once a day for additional 3 days. -If patient has trouble affording medicine, please send it to [facility], and ask patient to waive medication cost at the pharmacy counter. - GI follow up in 8 weeks.   b.  Hypertension Normotensive, we held amlodipine and HCTZ.  Hypokalemia repleted prior to discharge. -Please evaluate restarting these medicines.  c.  Polysubstance use Resources to help with substance use provided.   2.  Labs / imaging needed at time of follow-up: cbc, bmp  3.  Pending labs/ test needing follow-up: None.  4.  Medication Changes Abx -   End Date:  Follow-up Appointments: -GI follow-up   Hospital Course by problem list: Kyle Zamora is a 63 y.o. Male with PMH hypertension, BPPH who presented to the ED with 1 day of bright red blood per rectum and admitted for GI bleed.    # Acute GI bleed # Syncope Acute onset of bloody stools, and syncope.  Per EMS, he was hypotensive with systolic blood pressure in the 70s and hypothermic.  On labs, he was  afebrile, no leukocytosis and mild lactic acidosis.  UDS + cocaine He was volume repleted with IV fluids, and received 1unit of blood. CTA negative for acute bleed.  Patient was seen by gastroenterology, EGD showed gastritis/gastric stenosis with ulcer.  Colonoscopy showed an ulcer at the diverticulum that was clipped.  Was treated with IV PPI, transition to p.o. pantoprazole 40 mg twice daily for 30 days.  Hb stable at 10.5, no new episodes of bloody stools.  Will need to follow-up with GI outpatient in 8 weeks.  -Pantoprazole 40 mg twice daily for 30 days,---> continue with 40 mg once a day for additional 30 days. -Follow-up with GI in clinic. -Avoid  NSAIDs.   HTN Held hypertensives in the setting of the GI bleeding.  Holding amlodipine and HCTZ.   BPPH Continued with home Flomax.   HLD Cw atorvastatin 40 mg.    Polysubstance abuse Last alcohol and cocaine consumption 01/23.  No signs of withdrawal, continued with CIWA protocol with folic acid, multivitamin and thiamine supplementation.         Discharge Subjective: Patient evaluated at bedside this AM. No more episodes of bloody stools. Tolerated soft diet without any issues, will like to eat more solid food.  Discharge Exam:   BP 116/75 (BP Location: Right Arm)   Pulse 72   Temp 99.3 F (37.4 C) (Oral)   Resp 16   Ht 5\' 11"  (1.803 m)   Wt 79.4 kg   SpO2 100%   BMI 24.41 kg/m   Constitutional: NAD Cardiovascular: regular rate and rhythm, no m/r/g Pulmonary/Chest: normal work of breathing on room air, lungs clear to auscultation bilaterally Abdominal: soft, non-tender, non-distended Skin: warm and dry Psych: Mood and affect appropriate.   Pertinent Labs, Studies, and Procedures:     Latest Ref Rng & Units 12/03/2023    6:41 AM 12/02/2023    7:23 AM 12/02/2023   12:41 AM  CBC  WBC 4.0 - 10.5 K/uL 7.7  5.6    Hemoglobin 13.0 - 17.0 g/dL 56.2  13.0  86.5   Hematocrit 39.0 - 52.0 % 31.9  33.1  34.8   Platelets 150 - 400 K/uL 170  175         Latest Ref Rng & Units 12/03/2023    6:41 AM 12/02/2023    7:23 AM 12/01/2023    6:13 AM  CMP  Glucose 70 - 99 mg/dL 784  99  696   BUN 8 - 23 mg/dL 5  5  7    Creatinine 0.61 - 1.24 mg/dL 2.95  2.84  1.32   Sodium 135 - 145 mmol/L 141  143  140   Potassium 3.5 - 5.1 mmol/L 3.6  3.3  3.5   Chloride 98 - 111 mmol/L 109  108  106   CO2 22 - 32 mmol/L 24  25  22    Calcium 8.9 - 10.3 mg/dL 8.5  8.6  8.5   Total Protein 6.5 - 8.1 g/dL  5.6  5.7   Total Bilirubin 0.0 - 1.2 mg/dL  0.9  0.7   Alkaline Phos 38 - 126 U/L  64  60   AST 15 - 41 U/L  21  17   ALT 0 - 44 U/L  16  15     CT Head Wo Contrast Result Date:  12/01/2023 CLINICAL DATA:  Head trauma, moderate-severe; Neck trauma, dangerous injury mechanism (Age 28-64y); Facial trauma, blunt. Syncopal episode. Injury to the left side of the head. Neck pain. Right arm pain and paresthesias. EXAM: CT  HEAD WITHOUT CONTRAST CT MAXILLOFACIAL WITHOUT CONTRAST CT CERVICAL SPINE WITHOUT CONTRAST TECHNIQUE: Multidetector CT imaging of the head, cervical spine, and maxillofacial structures were performed using the standard protocol without intravenous contrast. Multiplanar CT image reconstructions of the cervical spine and maxillofacial structures were also generated. RADIATION DOSE REDUCTION: This exam was performed according to the departmental dose-optimization program which includes automated exposure control, adjustment of the mA and/or kV according to patient size and/or use of iterative reconstruction technique. COMPARISON:  None Available. FINDINGS: CT HEAD FINDINGS Brain: There is no evidence of an acute infarct, intracranial hemorrhage, mass, midline shift, or extra-axial fluid collection. The ventricles are normal in size with normal cerebral volume. Vascular: Calcified atherosclerosis at the skull base. No hyperdense vessel. Skull: No acute fracture or suspicious osseous lesion. Other: None. CT MAXILLOFACIAL FINDINGS Osseous: No acute fracture, suspicious osseous lesion, or mandibular dislocation. Absent maxillary dentition. Orbits: Unremarkable. Sinuses: Very mild scattered mucosal thickening throughout the paranasal sinuses. Clear mastoid air cells and middle ear cavities. Presume cerumen in the external auditory canals. Soft tissues: Unremarkable. CT CERVICAL SPINE FINDINGS Alignment: Cervical spine straightening.  No significant listhesis. Skull base and vertebrae: No acute fracture or suspicious osseous lesion. Soft tissues and spinal canal: No prevertebral fluid or swelling. No visible canal hematoma. Disc levels: Disc degeneration from C3-4 through C6-7 including  up to moderate disc space narrowing and prominent degenerative endplate irregularity and sclerosis. Suspected moderate spinal stenosis at these levels. Asymmetrically advanced neural foraminal stenosis on the left at C3-4 and on the right at C4-5 and C5-6. Upper chest: Emphysema and scarring in the included lung apices. Other: None. IMPRESSION: 1. No evidence of acute intracranial abnormality. 2. No acute maxillofacial or cervical spine fracture. 3. 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 Electronically Signed   By: Sebastian Ache M.D.   On: 12/01/2023 09:55   CT Cervical Spine Wo Contrast Result Date: 12/01/2023 CLINICAL DATA:  Head trauma, moderate-severe; Neck trauma, dangerous injury mechanism (Age 37-64y); Facial trauma, blunt. Syncopal episode. Injury to the left side of the head. Neck pain. Right arm pain and paresthesias. EXAM: CT HEAD WITHOUT CONTRAST CT MAXILLOFACIAL WITHOUT CONTRAST CT CERVICAL SPINE WITHOUT CONTRAST TECHNIQUE: Multidetector CT imaging of the head, cervical spine, and maxillofacial structures were performed using the standard protocol without intravenous contrast. Multiplanar CT image reconstructions of the cervical spine and maxillofacial structures were also generated. RADIATION DOSE REDUCTION: This exam was performed according to the departmental dose-optimization program which includes automated exposure control, adjustment of the mA and/or kV according to patient size and/or use of iterative reconstruction technique. COMPARISON:  None Available. FINDINGS: CT HEAD FINDINGS Brain: There is no evidence of an acute infarct, intracranial hemorrhage, mass, midline shift, or extra-axial fluid collection. The ventricles are normal in size with normal cerebral volume. Vascular: Calcified atherosclerosis at the skull base. No hyperdense vessel. Skull: No acute fracture or suspicious osseous lesion. Other:  None. CT MAXILLOFACIAL FINDINGS Osseous: No acute fracture, suspicious osseous lesion, or mandibular dislocation. Absent maxillary dentition. Orbits: Unremarkable. Sinuses: Very mild scattered mucosal thickening throughout the paranasal sinuses. Clear mastoid air cells and middle ear cavities. Presume cerumen in the external auditory canals. Soft tissues: Unremarkable. CT CERVICAL SPINE FINDINGS Alignment: Cervical spine straightening.  No significant listhesis. Skull base and vertebrae: No acute fracture or suspicious osseous lesion. Soft tissues and spinal canal: No prevertebral fluid or swelling. No visible canal hematoma. Disc levels: Disc degeneration from C3-4 through  C6-7 including up to moderate disc space narrowing and prominent degenerative endplate irregularity and sclerosis. Suspected moderate spinal stenosis at these levels. Asymmetrically advanced neural foraminal stenosis on the left at C3-4 and on the right at C4-5 and C5-6. Upper chest: Emphysema and scarring in the included lung apices. Other: None. IMPRESSION: 1. No evidence of acute intracranial abnormality. 2. No acute maxillofacial or cervical spine fracture. 3. 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 Electronically Signed   By: Sebastian Ache M.D.   On: 12/01/2023 09:55   CT Maxillofacial Wo Contrast Result Date: 12/01/2023 CLINICAL DATA:  Head trauma, moderate-severe; Neck trauma, dangerous injury mechanism (Age 44-64y); Facial trauma, blunt. Syncopal episode. Injury to the left side of the head. Neck pain. Right arm pain and paresthesias. EXAM: CT HEAD WITHOUT CONTRAST CT MAXILLOFACIAL WITHOUT CONTRAST CT CERVICAL SPINE WITHOUT CONTRAST TECHNIQUE: Multidetector CT imaging of the head, cervical spine, and maxillofacial structures were performed using the standard protocol without intravenous contrast. Multiplanar CT image reconstructions of the cervical  spine and maxillofacial structures were also generated. RADIATION DOSE REDUCTION: This exam was performed according to the departmental dose-optimization program which includes automated exposure control, adjustment of the mA and/or kV according to patient size and/or use of iterative reconstruction technique. COMPARISON:  None Available. FINDINGS: CT HEAD FINDINGS Brain: There is no evidence of an acute infarct, intracranial hemorrhage, mass, midline shift, or extra-axial fluid collection. The ventricles are normal in size with normal cerebral volume. Vascular: Calcified atherosclerosis at the skull base. No hyperdense vessel. Skull: No acute fracture or suspicious osseous lesion. Other: None. CT MAXILLOFACIAL FINDINGS Osseous: No acute fracture, suspicious osseous lesion, or mandibular dislocation. Absent maxillary dentition. Orbits: Unremarkable. Sinuses: Very mild scattered mucosal thickening throughout the paranasal sinuses. Clear mastoid air cells and middle ear cavities. Presume cerumen in the external auditory canals. Soft tissues: Unremarkable. CT CERVICAL SPINE FINDINGS Alignment: Cervical spine straightening.  No significant listhesis. Skull base and vertebrae: No acute fracture or suspicious osseous lesion. Soft tissues and spinal canal: No prevertebral fluid or swelling. No visible canal hematoma. Disc levels: Disc degeneration from C3-4 through C6-7 including up to moderate disc space narrowing and prominent degenerative endplate irregularity and sclerosis. Suspected moderate spinal stenosis at these levels. Asymmetrically advanced neural foraminal stenosis on the left at C3-4 and on the right at C4-5 and C5-6. Upper chest: Emphysema and scarring in the included lung apices. Other: None. IMPRESSION: 1. No evidence of acute intracranial abnormality. 2. No acute maxillofacial or cervical spine fracture. 3. 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 Electronically Signed   By: Sebastian Ache M.D.   On: 12/01/2023 09:55   CT ANGIO GI BLEED Result Date: 12/01/2023 CLINICAL DATA:  GI bleed, witnessed syncopal episode, rectal bleeding since yesterday, ETOH use EXAM: CTA ABDOMEN AND PELVIS WITHOUT AND WITH CONTRAST TECHNIQUE: Multidetector CT imaging of the abdomen and pelvis was performed using the standard protocol during bolus administration of intravenous contrast. Multiplanar reconstructed images and MIPs were obtained and reviewed to evaluate the vascular anatomy. RADIATION DOSE REDUCTION: This exam was performed according to the departmental dose-optimization program which includes automated exposure control, adjustment of the mA and/or kV according to patient size and/or use of iterative reconstruction technique. CONTRAST:  75mL OMNIPAQUE IOHEXOL 350 MG/ML SOLN COMPARISON:  None Available. FINDINGS: VASCULAR Normal caliber of the abdominal aorta. No evidence of aneurysm, dissection, or other acute  aortic pathology. 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 Moderate mixed calcific atherosclerosis. Review of the MIP images confirms the above findings. NON-VASCULAR Lower Chest: No acute findings. Hepatobiliary: No solid liver abnormality is seen. No gallstones, gallbladder wall thickening, or biliary dilatation. Pancreas: Unremarkable. No pancreatic ductal dilatation or surrounding inflammatory changes. Spleen: Normal in size without significant abnormality. Adrenals/Urinary Tract: Adrenal glands are unremarkable. Kidneys are normal, without renal calculi, solid lesion, or hydronephrosis. Bladder is unremarkable. Stomach/Bowel: Stomach is within normal limits. Appendix appears normal. No evidence of bowel wall thickening, distention, or inflammatory changes. Occasional colonic  diverticula, several with hyperdense, inspissated contents. No intraluminal contrast extravasation or other findings to specifically localize source of GI bleeding. Lymphatic: No enlarged abdominal or pelvic lymph nodes. Reproductive: Prostatomegaly. Other: No abdominal wall hernia or abnormality. No ascites. Musculoskeletal: No acute osseous findings. IMPRESSION: 1. Occasional colonic diverticula, several with hyperdense, inspissated contents. No intraluminal contrast extravasation or other findings to specifically localize source of GI bleeding. 2. Normal caliber of the abdominal aorta. No evidence of aneurysm, dissection, or other acute aortic pathology. Moderate mixed calcific atherosclerosis. 3. Prostatomegaly. Aortic Atherosclerosis (ICD10-I70.0). Electronically Signed   By: Jearld Lesch M.D.   On: 12/01/2023 09:36   DG Chest Portable 1 View Result Date: 12/01/2023 CLINICAL DATA:  63 year old male status post fall. EXAM: PORTABLE CHEST 1 VIEW COMPARISON:  12/09/2019 chest radiographs. FINDINGS: Portable AP supine view at 0631 hours. Lung volumes and mediastinal contours are stable and within normal limits. Visualized tracheal air column is within normal limits. Allowing for portable technique the lungs are clear. No pneumothorax or pleural effusion identified on this supine view. No acute osseous abnormality identified. IMPRESSION: No acute cardiopulmonary abnormality or acute traumatic injury identified. Electronically Signed   By: Odessa Fleming M.D.   On: 12/01/2023 06:42     Discharge Instructions: Discharge Instructions     Call MD for:  persistant dizziness or light-headedness   Complete by: As directed    Diet - low sodium heart healthy   Complete by: As directed    Discharge instructions   Complete by: As directed    - Follow up in clinic with PCP   Increase activity slowly   Complete by: As directed        Signed: Laretta Bolster, MD 12/03/2023, 1:43 PM   Pager: [phone]

## 2023-12-04 ENCOUNTER — Telehealth: Payer: Self-pay

## 2023-12-04 ENCOUNTER — Other Ambulatory Visit: Payer: Self-pay

## 2023-12-04 ENCOUNTER — Encounter (HOSPITAL_COMMUNITY): Payer: Self-pay | Admitting: Gastroenterology

## 2023-12-04 DIAGNOSIS — K922 Gastrointestinal hemorrhage, unspecified: Secondary | ICD-10-CM

## 2023-12-04 NOTE — Telephone Encounter (Signed)
Pt was made aware of Dr Barron Alvine recommendations: Order for lab placed in Epic. Pt made aware.  Pt notified to come and have lab drawn 12/11/2023. Location to lab given. Pt scheduled for an office visit on 01/10/2024 at 9:00 AM to see [facility]. Pt made aware. Pt Scheduled for the EGD on 02/05/2024 at 9:00 AM with Dr. Chales Abrahams in the [facility]. Pt made aware. Ambulatory referral to GI placed in Epic. Pt made aware. Prep instructions were created and sent to pt via mail. Pt verbalized understanding with all questions answered.

## 2023-12-04 NOTE — Transitions of Care (Post Inpatient/ED Visit) (Signed)
12/04/2023  Name: Kyle Zamora MRN: [id number] DOB: 04/30/1961  Today's TOC FU Call Status: Today's TOC FU Call Status:: Successful TOC FU Call Completed TOC FU Call Complete Date: 12/04/23 Patient's Name and Date of Birth confirmed.  Transition Care Management Follow-up Telephone Call Date of Discharge: 12/03/23 Discharge Facility: [facility]) Type of Discharge: Inpatient Admission Primary Inpatient Discharge Diagnosis:: hemorrhage How have you been since you were released from the hospital?: Better Any questions or concerns?: No  Items Reviewed: Did you receive and understand the discharge instructions provided?: Yes Medications obtained,verified, and reconciled?: Yes (Medications Reviewed) Any new allergies since your discharge?: No Dietary orders reviewed?: Yes  Medications Reviewed Today: Medications Reviewed Today     Reviewed by Karena Addison, LPN (Licensed Practical Nurse) on 12/04/23 at 1132  Med List Status: <None>   Medication Order Taking? Sig Documenting Provider Last Dose Status Informant  amLODipine (NORVASC) 5 MG tablet [id number] No Take 1 tablet (5 mg total) by mouth daily.  Patient not taking: Reported on 12/01/2023   Katheran James, DO Not Taking Active Self, Pharmacy Records           Med Note [facility] [place], [place] A   Fri Dec 01, 2023  2:19 PM) Pt states that he is out of this medication  atorvastatin (LIPITOR) 20 MG tablet [id number] No Take 1 tablet (20 mg total) by mouth daily.  Patient not taking: Reported on 12/01/2023   Katheran James, DO Not Taking Active Self, Pharmacy Records           Med Note [facility] [place], [place] A   Fri Dec 01, 2023  2:20 PM) Pt states that he did not start this medication when prescribed.  diclofenac Sodium (VOLTAREN ARTHRITIS PAIN) 1 % GEL [id number] No Apply 2 g topically 4 (four) times daily.  Patient not taking: Reported on 12/01/2023   Nooruddin, Jason Fila, MD Not Taking Active Self, Pharmacy Records   hydrochlorothiazide (HYDRODIURIL) 25 MG tablet [id number] No Take 1 tablet (25 mg total) by mouth daily.  Patient not taking: Reported on 12/01/2023   Katheran James, DO Not Taking Active Self, Pharmacy Records           Med Note [facility] [place], [place] A   Fri Dec 01, 2023  2:19 PM) Pt states that he is out of this medication  pantoprazole (PROTONIX) 40 MG tablet [id number]  Take 1 tablet (40 mg total) by mouth 2 (two) times daily. Rana Snare, DO  Active   tamsulosin [facility]) 0.4 MG CAPS capsule [id number] No Take 1 capsule (0.4 mg total) by mouth daily.  Patient not taking: Reported on 12/01/2023   Quincy Simmonds, MD Not Taking Active Self, Pharmacy Records           Med Note [facility] [place], [place] A   Fri Dec 01, 2023  2:19 PM) Pt states that he is out of this medication            Home Care and Equipment/Supplies: Were Home Health Services Ordered?: NA Any new equipment or medical supplies ordered?: NA  Functional Questionnaire: Do you need assistance with bathing/showering or dressing?: No Do you need assistance with meal preparation?: No Do you need assistance with eating?: No Do you have difficulty maintaining continence: No Do you need assistance with getting out of bed/getting out of a chair/moving?: No Do you have difficulty managing or taking your medications?: No  Follow up appointments reviewed: PCP Follow-up appointment confirmed?: No (no avail appts, sent message to  staff to schedule) MD Provider Line [phone] Given: No Specialist Hospital Follow-up appointment confirmed?: Yes Date of Specialist follow-up appointment?: 01/10/24 Follow-Up Specialty Provider:: Elaina Pattee you need transportation to your follow-up appointment?: No Do you understand care options if your condition(s) worsen?: Yes-patient verbalized understanding    SIGNATURE Karena Addison, LPN [facility] Nurse Health Advisor Direct Dial [phone]

## 2023-12-04 NOTE — Telephone Encounter (Signed)
-----   Message from Shellia Cleverly sent at 12/02/2023  1:09 PM EST ----- I anticipate this patient will be discharged home in the next day or so.  Can you please arrange for the following: -EGD with Dr Chales Abrahams in 8 weeks -CBC check in 7-10 days - Follow-up with Dr Chales Abrahams or one of the APPs in about 4-6 week.  Thanks.

## 2023-12-05 LAB — SURGICAL PATHOLOGY

## 2023-12-06 ENCOUNTER — Encounter: Payer: Self-pay | Admitting: Internal Medicine

## 2023-12-06 ENCOUNTER — Telehealth: Payer: Self-pay | Admitting: *Deleted

## 2023-12-06 NOTE — Telephone Encounter (Signed)
Patient here requesting letter to return to work on 12/12/2023.  Spoke with Dr. Mayford Knife .  Letter was done and given to patient to return to work on 12/12/2023.

## 2023-12-11 ENCOUNTER — Other Ambulatory Visit: Payer: Medicaid Other

## 2023-12-11 DIAGNOSIS — K922 Gastrointestinal hemorrhage, unspecified: Secondary | ICD-10-CM

## 2023-12-11 LAB — CBC WITH DIFFERENTIAL/PLATELET
Basophils Absolute: 0.1 10*3/uL (ref 0.0–0.1)
Basophils Relative: 1.1 % (ref 0.0–3.0)
Eosinophils Absolute: 0.2 10*3/uL (ref 0.0–0.7)
Eosinophils Relative: 4 % (ref 0.0–5.0)
HCT: 34.7 % — ABNORMAL LOW (ref 39.0–52.0)
Hemoglobin: 11.5 g/dL — ABNORMAL LOW (ref 13.0–17.0)
Lymphocytes Relative: 19.8 % (ref 12.0–46.0)
Lymphs Abs: 1 10*3/uL (ref 0.7–4.0)
MCHC: 33.2 g/dL (ref 30.0–36.0)
MCV: 96.3 fL (ref 78.0–100.0)
Monocytes Absolute: 0.4 10*3/uL (ref 0.1–1.0)
Monocytes Relative: 8.6 % (ref 3.0–12.0)
Neutro Abs: 3.4 10*3/uL (ref 1.4–7.7)
Neutrophils Relative %: 66.5 % (ref 43.0–77.0)
Platelets: 301 10*3/uL (ref 150.0–400.0)
RBC: 3.6 Mil/uL — ABNORMAL LOW (ref 4.22–5.81)
RDW: 15.6 % — ABNORMAL HIGH (ref 11.5–15.5)
WBC: 5.2 10*3/uL (ref 4.0–10.5)

## 2023-12-14 ENCOUNTER — Other Ambulatory Visit: Payer: Self-pay

## 2023-12-14 DIAGNOSIS — B9681 Helicobacter pylori [H. pylori] as the cause of diseases classified elsewhere: Secondary | ICD-10-CM

## 2023-12-15 ENCOUNTER — Other Ambulatory Visit: Payer: Self-pay

## 2023-12-15 MED ORDER — BISMUTH SUBSALICYLATE 262 MG PO CHEW: 524.0000 mg | CHEWABLE_TABLET | Freq: Four times a day (QID) | ORAL | 0 refills | Status: AC

## 2023-12-15 MED ORDER — METRONIDAZOLE 250 MG PO TABS: 250.0000 mg | ORAL_TABLET | Freq: Four times a day (QID) | ORAL | 0 refills | Status: AC

## 2023-12-15 MED ORDER — DOXYCYCLINE HYCLATE 100 MG PO CAPS: 100.0000 mg | ORAL_CAPSULE | Freq: Two times a day (BID) | ORAL | 0 refills | Status: AC

## 2023-12-20 ENCOUNTER — Ambulatory Visit (INDEPENDENT_AMBULATORY_CARE_PROVIDER_SITE_OTHER): Payer: Medicaid Other | Admitting: Student

## 2023-12-20 VITALS — BP 142/78 | HR 62 | Temp 98.0°F | Ht 71.0 in | Wt 172.6 lb

## 2023-12-20 DIAGNOSIS — N401 Enlarged prostate with lower urinary tract symptoms: Secondary | ICD-10-CM

## 2023-12-20 DIAGNOSIS — K922 Gastrointestinal hemorrhage, unspecified: Secondary | ICD-10-CM

## 2023-12-20 DIAGNOSIS — I10 Essential (primary) hypertension: Secondary | ICD-10-CM | POA: Diagnosis present

## 2023-12-20 MED ORDER — HYDROCHLOROTHIAZIDE 25 MG PO TABS: 25.0000 mg | ORAL_TABLET | Freq: Every day | ORAL | 11 refills | Status: DC

## 2023-12-20 MED ORDER — TAMSULOSIN HCL 0.4 MG PO CAPS: 0.4000 mg | ORAL_CAPSULE | Freq: Every day | ORAL | 3 refills | Status: DC

## 2023-12-20 MED ORDER — AMLODIPINE BESYLATE 5 MG PO TABS: 5.0000 mg | ORAL_TABLET | Freq: Every day | ORAL | 3 refills | Status: DC

## 2023-12-20 NOTE — Patient Instructions (Addendum)
Thank you, [name] for allowing [place] to provide your care today.    Today we discussed 1.  For your blood pressure, I have restarted amlodipine 5 mg and HCTZ 25 mg.  2.  For your BPH, I have sent Flomax to the pharmacy.  3.  For your stomach ulcers, continue pantoprazole 40 mg twice daily.  Follow-up with the GI doctor on 03/5  I have ordered the following labs for you:   Lab Orders         Basic metabolic panel         CBC      Tests ordered today:    Referrals ordered today:   Referral Orders  No referral(s) requested today     I have ordered the following medication/changed the following medications:   Stop the following medications: Medications Discontinued During This Encounter  Medication Reason   tamsulosin (FLOMAX) 0.4 MG CAPS capsule Reorder   amLODipine (NORVASC) 5 MG tablet Reorder   hydrochlorothiazide (HYDRODIURIL) 25 MG tablet Reorder     Start the following medications: Meds ordered this encounter  Medications   tamsulosin (FLOMAX) 0.4 MG CAPS capsule    Sig: Take 1 capsule (0.4 mg total) by mouth daily.    Dispense:  30 capsule    Refill:  3   hydrochlorothiazide (HYDRODIURIL) 25 MG tablet    Sig: Take 1 tablet (25 mg total) by mouth daily.    Dispense:  30 tablet    Refill:  11   amLODipine (NORVASC) 5 MG tablet    Sig: Take 1 tablet (5 mg total) by mouth daily.    Dispense:  30 tablet    Refill:  3     Follow up:  1 month for blood pressure recheck      Remember:   Should you have any questions or concerns please call the internal medicine clinic at [phone].     Laretta Bolster, MD  [facility]

## 2023-12-20 NOTE — Assessment & Plan Note (Addendum)
Blood pressure elevated at this visit. His antihypertensives were held during his hospitalization.  Home regimen includes  amlodipine and HCTZ, I will restart at this visit.  - Cw amlodipine 10 mg  - Cw HCTZ 25 mg

## 2023-12-20 NOTE — Assessment & Plan Note (Signed)
-  Continue with home Flomax.

## 2023-12-20 NOTE — Progress Notes (Signed)
   CC: Hospital follow-up  HPI:  [name] is a 63 y.o. male living with a history stated below and presents today for HFU.  He was hospitalized on 1/24 for syncope secondary to BRBPR.  After discharge, he denies ongoing hematemesis, hematochezia.  Past Medical History:  Diagnosis Date   Benign prostate hyperplasia    Hypertension     Current Outpatient Medications on File Prior to Visit  Medication Sig Dispense Refill   atorvastatin (LIPITOR) 20 MG tablet Take 1 tablet (20 mg total) by mouth daily. (Patient not taking: Reported on 12/01/2023) 90 tablet 3   bismuth subsalicylate (PEPTO-BISMOL) 262 MG chewable tablet Chew 2 tablets (524 mg total) by mouth in the morning, at noon, in the evening, and at bedtime for 14 days. 112 tablet 0   diclofenac Sodium (VOLTAREN ARTHRITIS PAIN) 1 % GEL Apply 2 g topically 4 (four) times daily. (Patient not taking: Reported on 12/01/2023) 50 g 0   doxycycline (VIBRAMYCIN) 100 MG capsule Take 1 capsule (100 mg total) by mouth 2 (two) times daily for 14 days. 28 capsule 0   metroNIDAZOLE (FLAGYL) 250 MG tablet Take 1 tablet (250 mg total) by mouth 4 (four) times daily for 14 days. 56 tablet 0   pantoprazole (PROTONIX) 40 MG tablet Take 1 tablet (40 mg total) by mouth 2 (two) times daily. 60 tablet 0   No current facility-administered medications on file prior to visit.      Review of Systems: ROS negative except for what is noted on the assessment and plan.  Vitals:   12/20/23 1130 12/20/23 1146  BP: (!) 162/72 (!) 142/78  Pulse: 64 62  Temp: 98 F (36.7 C)   TempSrc: Oral   SpO2: 100%   Weight: 172 lb 9.6 oz (78.3 kg)   Height: 5\' 11"  (1.803 m)     Physical Exam: Constitutional: NAD. Cardiovascular: regular rate and rhythm, no m/r/g Pulmonary/Chest: normal work of breathing on room air, lungs clear to auscultation bilaterally Abdominal: soft, non-tender, non-distended MSK: normal bulk and tone   Assessment & Plan:   Acute GI  bleeding He was hospitalized on 1/24 for syncope secondary to hematochezia.  Not endorsing ongoing hematemesis or hematochezia. He is compliant with pantoprazole 40 mg twice daily, and has an upcoming follow-up with GI on 3/5.  - Cw pantoprazole 40 mg twice daily ( end date 02/24), then Cw  pantoprazole 40 mg for 30 days - Follow-up with GI outpatient.  Hypertension Blood pressure elevated at this visit. His antihypertensives were held during his hospitalization.  Home regimen includes  amlodipine and HCTZ, I will restart at this visit.  - Cw amlodipine 10 mg  - Cw HCTZ 25 mg   BPH (benign prostatic hyperplasia) -Continue with home Flomax.   Patient discussed with Dr. Kizzie Ide, MD [facility] Internal Medicine, PGY-1 Pager: [phone] Date 12/20/2023 Time 8:59 PM

## 2023-12-20 NOTE — Assessment & Plan Note (Addendum)
He was hospitalized on 1/24 for syncope secondary to hematochezia.  Not endorsing ongoing hematemesis or hematochezia. He is compliant with pantoprazole 40 mg twice daily, and has an upcoming follow-up with GI on 3/5.  - Cw pantoprazole 40 mg twice daily ( end date 02/24), then Cw  pantoprazole 40 mg for 30 days - Follow-up with GI outpatient.

## 2023-12-21 ENCOUNTER — Other Ambulatory Visit: Payer: Self-pay

## 2023-12-21 LAB — CBC
Hematocrit: 35.8 % — ABNORMAL LOW (ref 37.5–51.0)
Hemoglobin: 11.1 g/dL — ABNORMAL LOW (ref 13.0–17.7)
MCH: 29.8 pg (ref 26.6–33.0)
MCHC: 31 g/dL — ABNORMAL LOW (ref 31.5–35.7)
MCV: 96 fL (ref 79–97)
Platelets: 246 10*3/uL (ref 150–450)
RBC: 3.72 x10E6/uL — ABNORMAL LOW (ref 4.14–5.80)
RDW: 13.6 % (ref 11.6–15.4)
WBC: 6.5 10*3/uL (ref 3.4–10.8)

## 2023-12-21 LAB — BASIC METABOLIC PANEL
BUN/Creatinine Ratio: 6 — ABNORMAL LOW (ref 10–24)
BUN: 4 mg/dL — ABNORMAL LOW (ref 8–27)
CO2: 24 mmol/L (ref 20–29)
Calcium: 9.6 mg/dL (ref 8.6–10.2)
Chloride: 105 mmol/L (ref 96–106)
Creatinine, Ser: 0.72 mg/dL — ABNORMAL LOW (ref 0.76–1.27)
Glucose: 97 mg/dL (ref 70–99)
Potassium: 4.8 mmol/L (ref 3.5–5.2)
Sodium: 143 mmol/L (ref 134–144)
eGFR: 103 mL/min/{1.73_m2} (ref >59)

## 2023-12-21 NOTE — Telephone Encounter (Signed)
Received a fax from pharmacy for hydrochlorothiazide, per pharmacy resend by different provider. System says that this providers npi is revoked

## 2023-12-22 NOTE — Progress Notes (Signed)
Hb stable at 11, normal CBC.

## 2023-12-28 NOTE — Progress Notes (Signed)
 Internal Medicine Clinic Attending  I was physically present during the key portions of the resident provided service and participated in the medical decision making of patient's management care. I reviewed pertinent patient test results.  The assessment, diagnosis, and plan were formulated together and I agree with the documentation in the resident's note.  Reymundo Poll, MD

## 2024-01-10 ENCOUNTER — Ambulatory Visit: Payer: Medicaid Other | Admitting: Gastroenterology

## 2024-01-11 ENCOUNTER — Ambulatory Visit

## 2024-01-11 ENCOUNTER — Telehealth: Payer: Self-pay | Admitting: *Deleted

## 2024-01-11 NOTE — Telephone Encounter (Signed)
 ID self and Gave Mother # for pt to call back. No other information given,

## 2024-01-11 NOTE — Telephone Encounter (Signed)
 Attempt to reach pt for pre-visit. LM with call back #.  Will attempt to reach again in 5 min due to no other # listed in profile  Second attempt to reach pt for pre-vist unsuccessful. LM with facility # for pt to call back. Instructed pt to call # given by end of the day and reschedule the pre-visit  with RN or the scheduled procedure will be canceled.

## 2024-01-11 NOTE — Telephone Encounter (Signed)
 Patient was rescheduled for today at 2:30 PM. Advised if he did not receive a call by 2:35PM to call the office. Patient understood and confirmed call.

## 2024-01-11 NOTE — Progress Notes (Unsigned)
 k

## 2024-01-11 NOTE — Telephone Encounter (Signed)
.  Attempt to reach pt for pre-visit. LM with call back #.  Will attempt to reach again in 5 min due to no other # listed in profil  Second attempt to reach pt for pre-vist unsuccessful. LM with facility # for pt to call back. Instructed pt to call # given by end of the day and reschedule the pre-visit  with RN or the scheduled procedure will be canceled.

## 2024-02-05 ENCOUNTER — Encounter: Payer: Medicaid Other | Admitting: Gastroenterology

## 2024-02-12 ENCOUNTER — Ambulatory Visit (AMBULATORY_SURGERY_CENTER)

## 2024-02-12 VITALS — Ht 71.0 in | Wt 175.0 lb

## 2024-02-12 DIAGNOSIS — K3189 Other diseases of stomach and duodenum: Secondary | ICD-10-CM

## 2024-02-12 NOTE — Progress Notes (Signed)
 No egg or soy allergy known to patient  No issues known to pt with past sedation with any surgeries or procedures Patient denies ever being told they had issues or difficulty with intubation  No FH of Malignant Hyperthermia Pt is not on diet pills Pt is not on  home 02  Pt is not on blood thinners  Pt denies issues with constipation  No A fib or A flutter Have any cardiac testing pending--No Pt can ambulate  Pt denies use of chewing tobacco Discussed diabetic I weight loss medication holds Discussed NSAID holds Checked BMI Pt instructed to use Single[employer] or GoodRx for a price reduction on prep  Pre visit completed

## 2024-02-20 NOTE — Progress Notes (Unsigned)
 Chief Complaint: Hospital follow-up Primary GI MD: Dr. Chales Abrahams  HPI: 63 year old male history of hypertension and BPH presents for hospital follow-up.  Admitted 12/01/2023 with lower GI bleed, syncope, hypotension. Hgb 11.1 EtOH 47/drug screen positive for cocaine BUN 7, creatinine 0.74  Patient underwent inpatient EGD/colonoscopy.  EGD with nonobstructing Schatzki ring.  H. pylori positive gastritis.  Acquired duodenal stenosis with ulceration.  Colonoscopy with diverticulosis s/p clip placement and sessile serrated polyps and sigmoid  Patient was given quadruple therapy with doxycycline, metronidazole, Pepto-Bismol, and pantoprazole and recommended a 5-year follow-up for his colonoscopy.  Patient presents here today and states he has not taken any medication over the last 5 months due to expense.  States he went to pick his amlodipine up and it was $4 which was too expensive for him.  States he is unsure if he completed his antibiotics though he does note he picked up "stomach medicine" from [facility] that was free and he completed the entire prescription.  Unclear if he completed his quadruple therapy given for H. pylori.  He denies GERD, nausea, vomiting.  States he has had no further rectal bleeding and is doing well overall.  PREVIOUS GI WORKUP   Colonoscopy 12/01/2023 - Diverticulosis in the sigmoid colon, in the descending colon and in the ascending colon. Clip ( MR conditional) was placed. Clip manufacturer: [employer].  - One 5 mm polyp in the transverse colon, removed with a cold snare. Resected and retrieved.  - Two 3 to 5 mm polyps in the sigmoid colon, removed with a cold snare. Resected and retrieved.  - Anal papilla( e) were hypertrophied.  - Non- bleeding internal hemorrhoids.  - The examined portion of the ileum was normal. - Repeat 5 years (2030)  EGD 12/02/2023 - Non- obstructing Schatzki ring.  - Normal esophagus.  - Mild, non- ulcer gastritis. Biopsied.   - Erythematous duodenopathy.  - Acquired duodenal stenosis with ulceration. Biopsied.  - Normal third portion of the duodenum.  FINAL MICROSCOPIC DIAGNOSIS:  A. DUODENUM, STRICTURE/ULCER, BIOPSY: -  Duodenal mucosa with prominent Brunner's glands, reactive/reparative change and focal evidence of erosion. -  Negative for dysplasia/malignancy on sections examined.  B. 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 -  A rare Helicobacter pylori organism is identified on the HE-stained slide, a confirmatory immunohistochemical stain is pending and will be reported in addendum.  C. COLON, TRANSVERSE, POLYPECTOMY: -  Intestinal mucosa architectural disarray and pyloric metaplasia.  Note: Pyloric gland metaplasia is typically seen after inflammatory injury in most frequently described in patients with inflammatory bowel disease (particularly Crohn's); however etiologies can be considered. Clinical correlation recommended.  D. COLON, SIGMOID, POLYPECTOMY: -  Sessile serrated polyp without dysplasi   Past Medical History:  Diagnosis Date   Benign prostate hyperplasia    Hypertension     Past Surgical History:  Procedure Laterality Date   BIOPSY  12/02/2023   Procedure: BIOPSY;  Surgeon: Shellia Cleverly, DO;  Location: [facility];  Service: Gastroenterology;;   COLONOSCOPY WITH PROPOFOL N/A 12/02/2023   Procedure: COLONOSCOPY WITH PROPOFOL;  Surgeon: Shellia Cleverly, DO;  Location: [facility];  Service: Gastroenterology;  Laterality: N/A;   ESOPHAGOGASTRODUODENOSCOPY (EGD) WITH PROPOFOL N/A 12/02/2023   Procedure: ESOPHAGOGASTRODUODENOSCOPY (EGD) WITH PROPOFOL;  Surgeon: Shellia Cleverly, DO;  Location: [facility];  Service: Gastroenterology;  Laterality: N/A;   HEMOSTASIS CLIP PLACEMENT  12/02/2023   Procedure: HEMOSTASIS CLIP PLACEMENT;  Surgeon: Shellia Cleverly,  DO;  Location: [facility];   Service: Gastroenterology;;   OPEN REDUCTION INTERNAL FIXATION (ORIF) DISTAL RADIAL FRACTURE Right 12/20/2019   Procedure: OPEN REDUCTION INTERNAL FIXATION (ORIF) RIGHT RADIUS SHAFT;  Surgeon: Rober Chimera, MD;  Location:  SURGERY CENTER;  Service: Orthopedics;  Laterality: Right;  PRE-OP BLOCK   OTHER SURGICAL HISTORY     "skull surgery as an infant"   POLYPECTOMY  12/02/2023   Procedure: POLYPECTOMY;  Surgeon: Annis Kinder, DO;  Location: [facility];  Service: Gastroenterology;;    Current Outpatient Medications  Medication Sig Dispense Refill   amLODipine (NORVASC) 5 MG tablet Take 1 tablet (5 mg total) by mouth daily. (Patient not taking: Reported on 02/21/2024) 30 tablet 3   atorvastatin (LIPITOR) 20 MG tablet Take 1 tablet (20 mg total) by mouth daily. (Patient not taking: Reported on 02/21/2024) 90 tablet 3   diclofenac Sodium (VOLTAREN ARTHRITIS PAIN) 1 % GEL Apply 2 g topically 4 (four) times daily. (Patient not taking: Reported on 02/21/2024) 50 g 0   hydrochlorothiazide (HYDRODIURIL) 25 MG tablet Take 1 tablet (25 mg total) by mouth daily. (Patient not taking: Reported on 02/21/2024) 30 tablet 11   pantoprazole (PROTONIX) 40 MG tablet Take 1 tablet (40 mg total) by mouth 2 (two) times daily. (Patient not taking: Reported on 02/21/2024) 60 tablet 0   tamsulosin (FLOMAX) 0.4 MG CAPS capsule Take 1 capsule (0.4 mg total) by mouth daily. (Patient not taking: Reported on 02/21/2024) 30 capsule 3   No current facility-administered medications for this visit.    Allergies as of 02/21/2024 - Review Complete 02/21/2024  Allergen Reaction Noted   Penicillins Itching 02/12/2024    Family History  Problem Relation Age of Onset   Cancer Mother    Hypertension Mother    Heart failure Father    Hypertension Father    Esophageal cancer Brother    Cancer Maternal Aunt        pancreatic cancer   Cancer Maternal Aunt    Cancer Other     Social History   Socioeconomic  History   Marital status: Single    Spouse name: Not on file   Number of children: Not on file   Years of education: Not on file   Highest education level: Not on file  Occupational History   Not on file  Tobacco Use   Smoking status: Every Day    Types: Cigarettes   Smokeless tobacco: Never   Tobacco comments:    Smokes when he drinks alcohol, 45 years   Vaping Use   Vaping status: Never Used  Substance and Sexual Activity   Alcohol use: Yes    Alcohol/week: 12.0 standard drinks of alcohol    Types: 12 Standard drinks or equivalent per week    Comment: 2-3 12 ox beers once a week and 12 pack on weekends   Drug use: No   Sexual activity: Not Currently    Partners: Female  Other Topics Concern   Not on file  Social History Narrative   Lives in [place] with mother, was a Statistician, currently looking for a job   Social Drivers of Health   Financial Resource Strain: Medium Risk (10/16/2023)   Overall Financial Resource Strain (CARDIA)    Difficulty of Paying Living Expenses: Somewhat hard  Food Insecurity: No Food Insecurity (12/01/2023)   Hunger Vital Sign    Worried About Running Out of Food in the Last Year: Never true    Ran Out of  Food in the Last Year: Never true  Transportation Needs: No Transportation Needs (12/01/2023)   PRAPARE - Administrator, Civil Service (Medical): No    Lack of Transportation (Non-Medical): No  Physical Activity: Inactive (10/16/2023)   Exercise Vital Sign    Days of Exercise per Week: 0 days    Minutes of Exercise per Session: 0 min  Stress: No Stress Concern Present (10/16/2023)   [employer] of Occupational Health - Occupational Stress Questionnaire    Feeling of Stress : Not at all  Social Connections: Moderately Isolated (10/16/2023)   Social Connection and Isolation Panel [NHANES]    Frequency of Communication with Friends and Family: Twice a week    Frequency of Social Gatherings with Friends and Family: Once a  week    Attends Religious Services: Never    Database administrator or Organizations: No    Attends Banker Meetings: Never    Marital Status: Living with partner  Intimate Partner Violence: Not At Risk (12/01/2023)   Humiliation, Afraid, Rape, and Kick questionnaire    Fear of Current or Ex-Partner: No    Emotionally Abused: No    Physically Abused: No    Sexually Abused: No    Review of Systems:    Constitutional: No weight loss, fever, chills, weakness or fatigue HEENT: Eyes: No change in vision               Ears, Nose, Throat:  No change in hearing or congestion Skin: No rash or itching Cardiovascular: No chest pain, chest pressure or palpitations   Respiratory: No SOB or cough Gastrointestinal: See HPI and otherwise negative Genitourinary: No dysuria or change in urinary frequency Neurological: No headache, dizziness or syncope Musculoskeletal: No new muscle or joint pain Hematologic: No bleeding or bruising Psychiatric: No history of depression or anxiety    Physical Exam:  Vital signs: BP 120/70   Pulse 80   Ht 5\' 11"  (1.803 m)   Wt 160 lb (72.6 kg)   BMI 22.32 kg/m   Constitutional: NAD, alert and cooperative Head:  Normocephalic and atraumatic. Eyes:   PEERL, EOMI. No icterus. Conjunctiva pink. Respiratory: Respirations even and unlabored. Lungs clear to auscultation bilaterally.   No wheezes, crackles, or rhonchi.  Cardiovascular:  Regular rate and rhythm. No peripheral edema, cyanosis or pallor.  Gastrointestinal:  Soft, nondistended, nontender. No rebound or guarding. Normal bowel sounds. No appreciable masses or hepatomegaly. Rectal:  Declines Msk:  Symmetrical without gross deformities. Without edema, no deformity or joint abnormality.  Neurologic:  Alert and  oriented x4;  grossly normal neurologically.  Skin:   Dry and intact without significant lesions or rashes. Psychiatric: Oriented to person, place and time. Demonstrates good judgement  and reason without abnormal affect or behaviors.   RELEVANT LABS AND IMAGING: CBC    Component Value Date/Time   WBC 6.5 12/20/2023 1204   WBC 5.2 12/11/2023 0918   RBC 3.72 (L) 12/20/2023 1204   RBC 3.60 (L) 12/11/2023 0918   HGB 11.1 (L) 12/20/2023 1204   HCT 35.8 (L) 12/20/2023 1204   PLT 246 12/20/2023 1204   MCV 96 12/20/2023 1204   MCH 29.8 12/20/2023 1204   MCH 31.1 12/03/2023 0641   MCHC 31.0 (L) 12/20/2023 1204   MCHC 33.2 12/11/2023 0918   RDW 13.6 12/20/2023 1204   LYMPHSABS 1.0 12/11/2023 0918   MONOABS 0.4 12/11/2023 0918   EOSABS 0.2 12/11/2023 0918   BASOSABS 0.1 12/11/2023 0918  CMP     Component Value Date/Time   NA 143 12/20/2023 1204   K 4.8 12/20/2023 1204   CL 105 12/20/2023 1204   CO2 24 12/20/2023 1204   GLUCOSE 97 12/20/2023 1204   GLUCOSE 106 (H) 12/03/2023 0641   BUN 4 (L) 12/20/2023 1204   CREATININE 0.72 (L) 12/20/2023 1204   CALCIUM 9.6 12/20/2023 1204   PROT 5.6 (L) 12/02/2023 0723   PROT 6.1 03/29/2023 1122   ALBUMIN 3.1 (L) 12/02/2023 0723   ALBUMIN 3.8 (L) 03/29/2023 1122   AST 21 12/02/2023 0723   ALT 16 12/02/2023 0723   ALKPHOS 64 12/02/2023 0723   BILITOT 0.9 12/02/2023 0723   BILITOT 0.3 03/29/2023 1122   GFRNONAA >60 12/03/2023 0641   GFRAA >60 12/17/2019 1025     Assessment/Plan:   H. pylori gastritis Identified on EGD January 2025 treated with quadruple therapy.  Recommended repeat EGD in 8 weeks.  Unclear if patient completed his antibiotics (states cost was too high for his medicines although he reports picking up "stomach medicine"  from [facility]) though he is not currently having any symptoms. - Check H. pylori Diatherix - If H. pylori positive, will resend in quadruple therapy and ensure patient picks up and completes antibiotic course - Repeat EGD in the [facility] - I thoroughly discussed the procedure with the patient (at bedside) to include nature of the procedure, alternatives, benefits, and risks (including but  not limited to bleeding, infection, perforation, anesthesia/cardiac pulmonary complications).  Patient verbalized understanding and gave verbal consent to proceed with procedure.   Hematochezia Hospital admission January 2025 undergoing EGD/colonoscopy which was notable for H. pylori positive gastritis, duodenal stenosis, colon polyps, and diverticulosis s/p clip placement - Stable recent hemoglobin - No further bleeding - Please let us  know if hematochezia returns  History of colon polyps Sessile serrated polyps on colonoscopy January 2025 with repeat 2030 - Repeat 2030   This visit required 35 minutes of patient care (this includes precharting, chart review, review of results, face-to-face time used for counseling as well as treatment plan and follow-up. The patient was provided an opportunity to ask questions and all were answered. The patient agreed with the plan and demonstrated an understanding of the instructions.   G[facility] 02/21/2024, 9:26 AM  Cc: Nooruddin, Saad, MD

## 2024-02-21 ENCOUNTER — Encounter: Payer: Self-pay | Admitting: Gastroenterology

## 2024-02-21 ENCOUNTER — Ambulatory Visit (INDEPENDENT_AMBULATORY_CARE_PROVIDER_SITE_OTHER): Admitting: Gastroenterology

## 2024-02-21 VITALS — BP 120/70 | HR 80 | Ht 71.0 in | Wt 160.0 lb

## 2024-02-21 DIAGNOSIS — Z1211 Encounter for screening for malignant neoplasm of colon: Secondary | ICD-10-CM

## 2024-02-21 DIAGNOSIS — K5730 Diverticulosis of large intestine without perforation or abscess without bleeding: Secondary | ICD-10-CM | POA: Diagnosis not present

## 2024-02-21 DIAGNOSIS — B9681 Helicobacter pylori [H. pylori] as the cause of diseases classified elsewhere: Secondary | ICD-10-CM | POA: Diagnosis not present

## 2024-02-21 DIAGNOSIS — K2970 Gastritis, unspecified, without bleeding: Secondary | ICD-10-CM | POA: Diagnosis not present

## 2024-02-21 DIAGNOSIS — K922 Gastrointestinal hemorrhage, unspecified: Secondary | ICD-10-CM

## 2024-02-21 DIAGNOSIS — Z860101 Personal history of adenomatous and serrated colon polyps: Secondary | ICD-10-CM

## 2024-02-21 DIAGNOSIS — K315 Obstruction of duodenum: Secondary | ICD-10-CM

## 2024-02-21 NOTE — Patient Instructions (Addendum)
 Your provider has ordered "Diatherix" stool testing for you. You have received a kit from our office today containing all necessary supplies to complete this test. Please carefully read the stool collection instructions provided in the kit before opening the accompanying materials. In addition, be sure there is a label providing your full name and date of birth on the "puritan opti-swab" tube that is supplied in the kit (if you do not see a label with this information on your test tube, please make us  aware before test collection!). After completing the test, you should secure the purtian tube into the specimen biohazard bag. The [facility] E-Req sheet (including date and time of specimen collection) should be placed into the outside pocket of the specimen biohazard bag and returned to the [place] lab (basement floor of [facility]) within 3 days of collection. Please make sure to give the specimen to a staff member at the lab. DO NOT leave the specimen on the counter.   If the specimen date and time (can be found in the upper right boxed portion of the sheet) are not filled out on the E-Req sheet, the test will NOT be performed.   Due to recent changes in healthcare laws, you may see the results of your imaging and laboratory studies on MyChart before your provider has had a chance to review them.  We understand that in some cases there may be results that are confusing or concerning to you. Not all laboratory results come back in the same time frame and the provider may be waiting for multiple results in order to interpret others.  Please give us  48 hours in order for your provider to thoroughly review all the results before contacting the office for clarification of your results.   We will contact you to schedule a follow up after your procedure  _______________________________________________________  If your blood pressure at your visit was 140/90 or greater, please  contact your primary care physician to follow up on this.  _______________________________________________________  If you are age 60 or older, your body mass index should be between 23-30. Your Body mass index is 22.32 kg/m. If this is out of the aforementioned range listed, please consider follow up with your Primary Care Provider.  If you are age 63 or younger, your body mass index should be between 19-25. Your Body mass index is 22.32 kg/m. If this is out of the aformentioned range listed, please consider follow up with your Primary Care Provider.   ________________________________________________________  The [facility] providers would like to encourage you to use MYCHART to communicate with providers for non-urgent requests or questions.  Due to long hold times on the telephone, sending your provider a message by [facility] may be a faster and more efficient way to get a response.  Please allow 48 business hours for a response.  Please remember that this is for non-urgent requests.  _______________________________________________________ Thank you for trusting me with your gastrointestinal care!   Suzanna Erp, PA

## 2024-02-22 NOTE — Progress Notes (Signed)
 Agree with assessment/plan.  Edman Circle, MD [facility] [phone]

## 2024-02-27 ENCOUNTER — Encounter: Admitting: Gastroenterology

## 2024-03-20 ENCOUNTER — Encounter: Payer: Self-pay | Admitting: Gastroenterology

## 2024-04-01 ENCOUNTER — Emergency Department (HOSPITAL_COMMUNITY)

## 2024-04-01 ENCOUNTER — Other Ambulatory Visit: Payer: Self-pay

## 2024-04-01 ENCOUNTER — Encounter (HOSPITAL_COMMUNITY): Payer: Self-pay

## 2024-04-01 ENCOUNTER — Inpatient Hospital Stay (HOSPITAL_COMMUNITY)
Admission: EM | Admit: 2024-04-01 | Discharge: 2024-04-04 | DRG: 312 | Disposition: A | Discharge status: Home / Self Care | Attending: Internal Medicine | Admitting: Internal Medicine

## 2024-04-01 DIAGNOSIS — R202 Paresthesia of skin: Secondary | ICD-10-CM

## 2024-04-01 DIAGNOSIS — E785 Hyperlipidemia, unspecified: Secondary | ICD-10-CM | POA: Diagnosis present

## 2024-04-01 DIAGNOSIS — E860 Dehydration: Secondary | ICD-10-CM | POA: Diagnosis present

## 2024-04-01 DIAGNOSIS — E876 Hypokalemia: Secondary | ICD-10-CM | POA: Diagnosis not present

## 2024-04-01 DIAGNOSIS — F17210 Nicotine dependence, cigarettes, uncomplicated: Secondary | ICD-10-CM | POA: Diagnosis present

## 2024-04-01 DIAGNOSIS — R55 Syncope and collapse: Principal | ICD-10-CM | POA: Diagnosis present

## 2024-04-01 DIAGNOSIS — I10 Essential (primary) hypertension: Secondary | ICD-10-CM | POA: Diagnosis present

## 2024-04-01 DIAGNOSIS — N400 Enlarged prostate without lower urinary tract symptoms: Secondary | ICD-10-CM | POA: Diagnosis present

## 2024-04-01 DIAGNOSIS — G9589 Other specified diseases of spinal cord: Secondary | ICD-10-CM | POA: Diagnosis present

## 2024-04-01 DIAGNOSIS — M542 Cervicalgia: Secondary | ICD-10-CM | POA: Diagnosis present

## 2024-04-01 DIAGNOSIS — F10129 Alcohol abuse with intoxication, unspecified: Secondary | ICD-10-CM | POA: Diagnosis present

## 2024-04-01 DIAGNOSIS — Z79899 Other long term (current) drug therapy: Secondary | ICD-10-CM

## 2024-04-01 DIAGNOSIS — M4802 Spinal stenosis, cervical region: Secondary | ICD-10-CM | POA: Diagnosis present

## 2024-04-01 DIAGNOSIS — M5412 Radiculopathy, cervical region: Secondary | ICD-10-CM | POA: Diagnosis present

## 2024-04-01 DIAGNOSIS — F1410 Cocaine abuse, uncomplicated: Secondary | ICD-10-CM | POA: Diagnosis present

## 2024-04-01 DIAGNOSIS — I951 Orthostatic hypotension: Principal | ICD-10-CM | POA: Diagnosis present

## 2024-04-01 DIAGNOSIS — E861 Hypovolemia: Secondary | ICD-10-CM | POA: Diagnosis present

## 2024-04-01 HISTORY — DX: Orthostatic hypotension: I95.1

## 2024-04-01 LAB — CBC
HCT: 42.5 % (ref 39.0–52.0)
Hemoglobin: 13.8 g/dL (ref 13.0–17.0)
MCH: 27.9 pg (ref 26.0–34.0)
MCHC: 32.5 g/dL (ref 30.0–36.0)
MCV: 85.9 fL (ref 80.0–100.0)
Platelets: 216 10*3/uL (ref 150–400)
RBC: 4.95 MIL/uL (ref 4.22–5.81)
RDW: 16.4 % — ABNORMAL HIGH (ref 11.5–15.5)
WBC: 9.1 10*3/uL (ref 4.0–10.5)
nRBC: 0 % (ref 0.0–0.2)

## 2024-04-01 LAB — CBG MONITORING, ED: Glucose-Capillary: 125 mg/dL — ABNORMAL HIGH (ref 70–99)

## 2024-04-01 LAB — URINALYSIS, ROUTINE W REFLEX MICROSCOPIC
Bilirubin Urine: NEGATIVE
Glucose, UA: NEGATIVE mg/dL
Hgb urine dipstick: NEGATIVE
Ketones, ur: NEGATIVE mg/dL
Leukocytes,Ua: NEGATIVE
Nitrite: NEGATIVE
Protein, ur: NEGATIVE mg/dL
Specific Gravity, Urine: 1.042 — ABNORMAL HIGH (ref 1.005–1.030)
pH: 5 (ref 5.0–8.0)

## 2024-04-01 LAB — COMPREHENSIVE METABOLIC PANEL WITH GFR
ALT: 15 U/L (ref 0–44)
AST: 19 U/L (ref 15–41)
Albumin: 4 g/dL (ref 3.5–5.0)
Alkaline Phosphatase: 78 U/L (ref 38–126)
Anion gap: 12 (ref 5–15)
BUN: 9 mg/dL (ref 8–23)
CO2: 26 mmol/L (ref 22–32)
Calcium: 10 mg/dL (ref 8.9–10.3)
Chloride: 101 mmol/L (ref 98–111)
Creatinine, Ser: 0.78 mg/dL (ref 0.61–1.24)
GFR, Estimated: >60 mL/min (ref >60)
Glucose, Bld: 119 mg/dL — ABNORMAL HIGH (ref 70–99)
Potassium: 4 mmol/L (ref 3.5–5.1)
Sodium: 139 mmol/L (ref 135–145)
Total Bilirubin: 0.7 mg/dL (ref 0.0–1.2)
Total Protein: 7.7 g/dL (ref 6.5–8.1)

## 2024-04-01 MED ORDER — ENOXAPARIN SODIUM 40 MG/0.4ML IJ SOSY
40.0000 mg | PREFILLED_SYRINGE | INTRAMUSCULAR | Status: DC
  Administered 2024-04-03 – 2024-04-04 (×2): 40 mg via SUBCUTANEOUS
  Filled 2024-04-01 (×3): qty 0.4

## 2024-04-01 MED ORDER — ACETAMINOPHEN 500 MG PO TABS
1000.0000 mg | ORAL_TABLET | Freq: Four times a day (QID) | ORAL | Status: DC
  Administered 2024-04-01 – 2024-04-04 (×10): 1000 mg via ORAL
  Filled 2024-04-01 (×11): qty 2

## 2024-04-01 MED ORDER — METHOCARBAMOL 750 MG PO TABS
1500.0000 mg | ORAL_TABLET | Freq: Three times a day (TID) | ORAL | Status: DC
  Administered 2024-04-01 – 2024-04-04 (×8): 1500 mg via ORAL
  Filled 2024-04-01 (×2): qty 2
  Filled 2024-04-01: qty 3
  Filled 2024-04-01 (×2): qty 2
  Filled 2024-04-01: qty 3
  Filled 2024-04-01 (×2): qty 2

## 2024-04-01 MED ORDER — FENTANYL CITRATE PF 50 MCG/ML IJ SOSY
50.0000 ug | PREFILLED_SYRINGE | INTRAMUSCULAR | Status: DC | PRN
  Administered 2024-04-01 (×2): 50 ug via INTRAVENOUS
  Filled 2024-04-01 (×2): qty 1

## 2024-04-01 MED ORDER — IOHEXOL 350 MG/ML SOLN
75.0000 mL | Freq: Once | INTRAVENOUS | Status: AC | PRN
  Administered 2024-04-01: 75 mL via INTRAVENOUS

## 2024-04-01 MED ORDER — TAMSULOSIN HCL 0.4 MG PO CAPS
0.4000 mg | ORAL_CAPSULE | Freq: Every day | ORAL | Status: DC
Start: 2024-04-02 — End: 2024-04-04
  Administered 2024-04-02 – 2024-04-04 (×3): 0.4 mg via ORAL
  Filled 2024-04-01 (×3): qty 1

## 2024-04-01 MED ORDER — OXYCODONE HCL 5 MG PO TABS
5.0000 mg | ORAL_TABLET | ORAL | Status: DC | PRN
  Administered 2024-04-01 – 2024-04-04 (×8): 5 mg via ORAL
  Filled 2024-04-01 (×8): qty 1

## 2024-04-01 NOTE — ED Notes (Signed)
 Patient transported to MRI

## 2024-04-01 NOTE — ED Provider Notes (Signed)
 [place] EMERGENCY DEPARTMENT AT [facility] Provider Note   CSN: [id number] Arrival date & time: 04/01/24  1640     History  Chief Complaint  Patient presents with   Fall   Head Injury    Kyle Zamora is a 63 y.o. male.  HPI    63 year old male comes in with chief complaint of fall, head injury and bilateral upper extremity tingling and pain.  Patient has history of hypertension and BPH.  He states that yesterday morning around 9 AM he was walking back home. Next think he recalls he was on the ground.  Patient woke up, unsure what happened.  He noted that he was having significant discomfort at the back of his head and was bleeding from that side.  He proceeded to go home.  About 30 minutes later he started experiencing tingling sensation and numbness to his upper extremities bilaterally.  In addition to the pins and needle sensation he is also having significant pain over his shoulder and arms.   Patient could not sleep well at night because of the pain.  Given that his symptoms are not resolving, he decided to come to the ER.  Patient denies any one-sided weakness, numbness, bowel or bladder incontinence.  He denies any chest pain, shortness of breath, palpitations prior to fainting.  Home Medications Prior to Admission medications   Medication Sig Start Date End Date Taking? Authorizing Provider  amLODipine  (NORVASC ) 5 MG tablet Take 1 tablet (5 mg total) by mouth daily. Patient not taking: Reported on 02/21/2024 12/20/23   Marni Sins, MD  atorvastatin  (LIPITOR) 20 MG tablet Take 1 tablet (20 mg total) by mouth daily. Patient not taking: Reported on 02/21/2024 10/17/23 10/16/24  Carleen Chary, DO  diclofenac  Sodium (VOLTAREN  ARTHRITIS PAIN) 1 % GEL Apply 2 g topically 4 (four) times daily. Patient not taking: Reported on 02/21/2024 07/04/23   Nooruddin, Saad, MD  hydrochlorothiazide  (HYDRODIURIL ) 25 MG tablet Take 1 tablet (25 mg total) by mouth  daily. Patient not taking: Reported on 02/21/2024 12/20/23   Marni Sins, MD  pantoprazole  (PROTONIX ) 40 MG tablet Take 1 tablet (40 mg total) by mouth 2 (two) times daily. Patient not taking: Reported on 02/21/2024 12/03/23   Jearldine Mina, DO  tamsulosin  (FLOMAX ) 0.4 MG CAPS capsule Take 1 capsule (0.4 mg total) by mouth daily. Patient not taking: Reported on 02/21/2024 12/20/23   Koomson, Julius, MD      Allergies    Patient has no known allergies.    Review of Systems   Review of Systems  All other systems reviewed and are negative.   Physical Exam Updated Vital Signs BP (!) 146/88   Pulse 81   Temp 98.6 F (37 C) (Oral)   Resp (!) 25   Ht 5\' 11"  (1.803 m)   Wt 72.6 kg   SpO2 97%   BMI 22.32 kg/m  Physical Exam Vitals and nursing note reviewed.  Constitutional:      Appearance: He is well-developed.  HENT:     Head:     Comments: Patient has a large stellate type laceration over the vertex, measuring about 10 cm in total Eyes:     Extraocular Movements: Extraocular movements intact.     Pupils: Pupils are equal, round, and reactive to light.  Neck:     Comments: Patient has lower C-spine midline tenderness Cardiovascular:     Rate and Rhythm: Normal rate.  Pulmonary:     Effort: Pulmonary effort is normal.  Musculoskeletal:     Comments: Reproducible pain with palpation of bilateral upper extremity, worse in the proximal region  Skin:    General: Skin is warm.  Neurological:     Mental Status: He is alert and oriented to person, place, and time.     Sensory: Sensory deficit present.     Motor: No weakness.     Comments: Patient has subjective paresthesias of his upper extremity, more pronounced proximally     ED Results / Procedures / Treatments   Labs (all labs ordered are listed, but only abnormal results are displayed) Labs Reviewed  COMPREHENSIVE METABOLIC PANEL WITH GFR - Abnormal; Notable for the following components:      Result Value   Glucose,  Bld 119 (*)    All other components within normal limits  CBC - Abnormal; Notable for the following components:   RDW 16.4 (*)    All other components within normal limits  CBG MONITORING, ED - Abnormal; Notable for the following components:   Glucose-Capillary 125 (*)    All other components within normal limits  URINALYSIS, ROUTINE W REFLEX MICROSCOPIC    EKG EKG Interpretation Date/Time:  Monday Apr 01 2024 17:04:42 EDT Ventricular Rate:  85 PR Interval:  158 QRS Duration:  90 QT Interval:  348 QTC Calculation: 414 R Axis:   78  Text Interpretation: Normal sinus rhythm Biatrial enlargement Early repolarization Abnormal ECG When compared with ECG of 17-Dec-2019 07:21, PREVIOUS ECG IS PRESENT No significant change since last tracing Confirmed by Deatra Face [phone]) on 04/01/2024 6:32:36 PM  Radiology CT C-SPINE NO CHARGE Result Date: 04/01/2024 CLINICAL DATA:  Fall EXAM: CT CERVICAL SPINE WITHOUT CONTRAST TECHNIQUE: Multidetector CT imaging of the cervical spine was performed without intravenous contrast. Multiplanar CT image reconstructions were also generated. RADIATION DOSE REDUCTION: This exam was performed according to the departmental dose-optimization program which includes automated exposure control, adjustment of the mA and/or kV according to patient size and/or use of iterative reconstruction technique. COMPARISON:  None Available. FINDINGS: Alignment: No static subluxation. Facets are aligned. Occipital condyles and the lateral masses of C1 and C2 are normally approximated. Skull base and vertebrae: No acute fracture. Soft tissues and spinal canal: No prevertebral fluid or swelling. No visible canal hematoma. Disc levels: No advanced spinal canal or neural foraminal stenosis. Upper chest: No pneumothorax, pulmonary nodule or pleural effusion. Other: Normal visualized paraspinal cervical soft tissues. IMPRESSION: No acute fracture or static subluxation of the cervical spine.  Electronically Signed   By: Juanetta Nordmann M.D.   On: 04/01/2024 20:40   CT ANGIO HEAD NECK W WO CM Result Date: 04/01/2024 CLINICAL DATA:  Fall EXAM: CT ANGIOGRAPHY HEAD AND NECK WITH AND WITHOUT CONTRAST TECHNIQUE: Multidetector CT imaging of the head and neck was performed using the standard protocol during bolus administration of intravenous contrast. Multiplanar CT image reconstructions and MIPs were obtained to evaluate the vascular anatomy. Carotid stenosis measurements (when applicable) are obtained utilizing NASCET criteria, using the distal internal carotid diameter as the denominator. RADIATION DOSE REDUCTION: This exam was performed according to the departmental dose-optimization program which includes automated exposure control, adjustment of the mA and/or kV according to patient size and/or use of iterative reconstruction technique. CONTRAST:  75mL OMNIPAQUE  IOHEXOL  350 MG/ML SOLN COMPARISON:  None Available. FINDINGS: CT HEAD FINDINGS Brain: No mass,hemorrhage or extra-axial collection. Normal appearance of the parenchyma and CSF spaces. Vascular: No hyperdense vessel or unexpected vascular calcification. Skull: The visualized skull base, calvarium and extracranial soft  tissues are normal. Sinuses/Orbits: No fluid levels or advanced mucosal thickening of the visualized paranasal sinuses. No mastoid or middle ear effusion. Normal orbits. CTA NECK FINDINGS Skeleton: No acute abnormality or high grade bony spinal canal stenosis. Other neck: Normal pharynx, larynx and major salivary glands. No cervical lymphadenopathy. Unremarkable thyroid gland. Upper chest: No pneumothorax or pleural effusion. No nodules or masses. Aortic arch: There is no calcific atherosclerosis of the aortic arch. Conventional 3 vessel aortic branching pattern. RIGHT carotid system: Atherosclerotic web at the carotid bifurcation without stenosis. LEFT carotid system: Normal without aneurysm, dissection or stenosis. Vertebral  arteries: Left dominant configuration. There is no dissection, occlusion or flow-limiting stenosis to the skull base (V1-V3 segments). CTA HEAD FINDINGS POSTERIOR CIRCULATION: Vertebral arteries are normal. No proximal occlusion of the anterior or inferior cerebellar arteries. Basilar artery is normal. Superior cerebellar arteries are normal. Posterior cerebral arteries are normal. ANTERIOR CIRCULATION: Intracranial internal carotid arteries are normal. Anterior cerebral arteries are normal. Middle cerebral arteries are normal. Venous sinuses: As permitted by contrast timing, patent. Anatomic variants: None Review of the MIP images confirms the above findings. IMPRESSION: 1. No emergent large vessel occlusion or high-grade stenosis of the intracranial arteries. 2. 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 Electronically Signed   By: Juanetta Nordmann M.D.   On: 04/01/2024 20:39   MR Cervical Spine Wo Contrast Result Date: 04/01/2024 CLINICAL DATA:  Trauma EXAM: MRI CERVICAL SPINE WITHOUT CONTRAST TECHNIQUE: Multiplanar, multisequence MR imaging of the cervical spine was performed. No intravenous contrast was administered. COMPARISON:  None Available. FINDINGS: Alignment: Reversed cervical lordosis.  No static subluxation. Vertebrae: No acute fracture. Major cervical ligaments are intact. Large ventral osteophytes at C4 and C5. Cord: Multifocal hyperintense T2-weighted signal within the spinal cord, most notably at C3-4 and C6-7, likely myelomalacia. Posterior Fossa, vertebral arteries, paraspinal tissues: Negative. Disc levels: C1-2: Unremarkable. C2-3: Normal disc space and facet joints. There is no spinal canal stenosis. No neural foraminal stenosis. C3-4: Left asymmetric disc bulge with uncovertebral hypertrophy. Mild spinal canal stenosis. Severe left neural foraminal stenosis. C4-5: Right asymmetric disc bulge with uncovertebral hypertrophy. There is no spinal canal stenosis. Severe  right neural foraminal stenosis. C5-6: Disc bulge with bilateral uncovertebral hypertrophy. Mild spinal canal stenosis. Severe bilateral neural foraminal stenosis. C6-7: Disc bulge and bilateral uncovertebral hypertrophy. Moderate spinal canal stenosis. Severe bilateral neural foraminal stenosis. C7-T1: Normal disc space and facet joints. There is no spinal canal stenosis. No neural foraminal stenosis. IMPRESSION: 1. No acute fracture or ligamentous injury of the cervical spine. 2. Moderate spinal canal stenosis at C6-7 and mild spinal canal stenosis at C3-4 and C5-6. 3. Multifocal hyperintense T2-weighted signal within the spinal cord, most notably at C3-4 and C6-7, likely myelomalacia. 4. Severe left C4, right C5, bilateral C6 and bilateral C7 neural foraminal stenosis. Electronically Signed   By: Juanetta Nordmann M.D.   On: 04/01/2024 20:08    Procedures Procedures    Medications Ordered in ED Medications  fentaNYL  (SUBLIMAZE ) injection 50 mcg (50 mcg Intravenous Given 04/01/24 2120)  iohexol  (OMNIPAQUE ) 350 MG/ML injection 75 mL (75 mLs Intravenous Contrast Given 04/01/24 2022)    ED Course/ Medical Decision Making/ A&P                                 Medical Decision Making Amount and/or Complexity of Data Reviewed Labs: ordered. Radiology: ordered.  Risk Prescription drug management. Decision regarding  hospitalization.   63 year old patient comes in after sustaining what appears to fall due to syncope. Pertinent past medical includes : Hypertension, no blood thinner use.  Based on my history and exam, differential diagnosis from trauma perspective includes: - Traumatic brain injury including intracranial hemorrhage - Soft tissue injury/ligament tear to the cervical spine - Concussion - Cervical spine fracture - Spinal cord myelopathy - Central cord syndrome - Vertebral dissection  It is also concerning to me that patient had syncope without any prodrome.  Differential for  syncope includes: Orthostatic hypotension Stroke Vertebral artery dissection/stenosis Dysrhythmia PE Vasovagal/neurocardiogenic syncope Aortic stenosis Valvular disorder/Cardiomyopathy Anemia   Based on the initial assessment, the following workup was initiated CT scan of the brain and C-spine, angiogram. If that is negative, we will proceed with MRI of the cervical spine.  Patient will likely need admission to the hospital for syncope.  I have independently interpreted the following imaging from the perspective of acute trauma: CT angio head and neck and the results indicate no evidence of brain bleed.  Reassessment: Patient's MRI is negative for severe myelopathy, cord compression, ligamentous injury.  There is severe spinal stenosis. However, symptoms still concerning to me in the setting of hyperextension.  I discussed case with neurosurgery, Sarah APP. They will see the patient tomorrow, she will review the MRI with the on-call neurosurgeon.    Final Clinical Impression(s) / ED Diagnoses Final diagnoses:  Syncope and collapse  Paresthesia of upper extremity  Spinal stenosis of cervical region    Rx / DC Orders ED Discharge Orders     None         Deatra Face, MD 04/01/24 2128

## 2024-04-01 NOTE — ED Triage Notes (Signed)
 Pt states he was walking home from store yesterday around 11am and passed out and fell backwards striking back of head. Pt states all day yesterday he had body aches and body felt like pins and needles on skin. Not on blood thinners. Today states body feels sore all over.

## 2024-04-01 NOTE — H&P (Addendum)
 Date: 04/01/2024               Patient Name:  Kyle Zamora MRN: [id number]  DOB: 07/08/1961 Age / Sex: 63 y.o., male   PCP: Nooruddin, Saad, MD         Medical Service: Internal Medicine Teaching Service         Attending Physician: Dr. Broadus Canes, Whitney Hams, MD      First Contact: [phone]    Second Contact: [phone]     SUBJECTIVE   Chief Complaint: fall  History of Present Illness:  Kyle Zamora is a 63 year old with PMH of gastritis with ulcer, HTN, and HLD who presents with fall concerning for syncope complicated by bilateral upper extremity paresthesia and weakness.  On Sunday morning, he woke up and decided to go to the store, which is about 2 blocks from where he lives with his mother. When he was walking back, he fell and woke up on the ground. He denies tripping and does not remember what caused him to end up on the ground. He denies dizziness,light headedness, heart palpitations prior to fall. That morning he had taken his 2 blood pressure medication which he normally takes. He had not eaten breakfast yet. About 10 minutes after getting up to continue walking home, he started feeling worsening numbness in his right upper extremity and new numbness in his left upper extremity. He stayed at home the rest of the day but started to have worsening pain in his neck, shoulders and arms. His upper extremities started feeling increasingly weak leading him to come to the ED on Monday afternoon. In the past, he has had some numbness in his right hand but symptoms are more sever now and extend into arm/ shoulder.  He has history of syncope leading to fall in [place] and was admitted and treated for GI bleed. That time he recalls getting up from bed to use the bathroom and feeling very dizzy prior to passing out. Found to be hypotensive and hypothermic. Endoscopy/ colonoscopy at that time showed gastritis and multiple diverticula with small bleeding ulcer. Since that admission he denies additional  episodes of bright red blood in bowel movements.   He denies urinary or bowel incontinence, weakness to bilateral lower extremities, and saddle anesthesia.  Code status: full code; [name] his mother is POA  ED Course: CBG 125 Hgb 13.8 Bilateral upper extremity paresthesias noted.  CT spine ok CT angio head- no emergent LVO  Neurosurgery consulted by EDP.  MRI cervical spine IMPRESSION: 1. No acute fracture or ligamentous injury of the cervical spine. 2. Moderate spinal canal stenosis at C6-7 and mild spinal canal stenosis at C3-4 and C5-6. 3. Multifocal hyperintense T2-weighted signal within the spinal cord, most notably at C3-4 and C6-7, likely myelomalacia. 4. Severe left C4, right C5, bilateral C6 and bilateral C7 neural foraminal stenosis.  Given fentanyl   Past Medical History Diverticulosis of colon HTN BPH  Past Surgical History:  Procedure Laterality Date   BIOPSY  12/02/2023   Procedure: BIOPSY;  Surgeon: Annis Kinder, DO;  Location: [facility];  Service: Gastroenterology;;   COLONOSCOPY WITH PROPOFOL  N/A 12/02/2023   Procedure: COLONOSCOPY WITH PROPOFOL ;  Surgeon: Annis Kinder, DO;  Location: [facility];  Service: Gastroenterology;  Laterality: N/A;   ESOPHAGOGASTRODUODENOSCOPY (EGD) WITH PROPOFOL  N/A 12/02/2023   Procedure: ESOPHAGOGASTRODUODENOSCOPY (EGD) WITH PROPOFOL ;  Surgeon: Annis Kinder, DO;  Location: [facility];  Service: Gastroenterology;  Laterality: N/A;   HEMOSTASIS CLIP PLACEMENT  12/02/2023   Procedure: HEMOSTASIS CLIP PLACEMENT;  Surgeon: Annis Kinder, DO;  Location: [facility];  Service: Gastroenterology;;   OPEN REDUCTION INTERNAL FIXATION (ORIF) DISTAL RADIAL FRACTURE Right 12/20/2019   Procedure: OPEN REDUCTION INTERNAL FIXATION (ORIF) RIGHT RADIUS SHAFT;  Surgeon: Rober Chimera, MD;  Location:  SURGERY CENTER;  Service: Orthopedics;  Laterality: Right;  PRE-OP BLOCK   OTHER SURGICAL HISTORY     "skull  surgery as an infant"   POLYPECTOMY  12/02/2023   Procedure: POLYPECTOMY;  Surgeon: Annis Kinder, DO;  Location: [facility];  Service: Gastroenterology;;    Meds:  Amlodipine  5 mg Hydrochlorothiazide  25 mg Tamsulosin  0.4 mg  Social:  Lives With: Mother, [name] 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 Has never had withdrawal symptoms from alcohol, current tobacco smokes 4-6 cigarettes daily, prior cocaine use used several months ago  Family History: no history of syncope  Allergies: Allergies as of 04/01/2024   (No Known Allergies)    Review of Systems: A complete ROS was negative except as per HPI.   OBJECTIVE:   Physical Exam: Blood pressure (!) 146/88, pulse 81, temperature 98.6 F (37 C), temperature source Oral, resp. rate (!) 25, height 5\' 11"  (1.803 m), weight 72.6 kg, SpO2 97%.  Constitutional: well-appearing, C-collar in place, grimaces with repositioning  HENT: hematoma present to crown of head with bandage in place below c-collar Neck: c-collar in place Cardiovascular: regular rate and rhythm, no m/r/g Pulmonary/Chest: normal work of breathing on room air, lungs clear to auscultation bilaterally Abdominal: soft, non-tender, non-distended MSK: No gross abnormalities of bilateral shoulders, diffuse TTP along deltoid and trapezius bilaterally, no pain over clavicles of GH joint Neurological:  Mental Status: Patient is awake, alert, oriented x3 No signs of aphasia or neglect Pupils are equal round and reactive to light Motor: good effort thorughout, 4/5 in RUE and LUE abduction and adduction limited due to shoulder pain, 5/5 with bilateral flexion/extension at elbow, grip strength 5/5 bilaterally 5/5 strength in bilateral hip and knee flexion/  extension Sensory: Sensation is grossly intact to bilateral UE & LE  Labs:    Latest Ref Rng & Units 04/01/2024    5:03 PM 12/20/2023   12:04 PM 12/03/2023    6:41 AM  BMP  Glucose 70 - 99 mg/dL 161  97  096   BUN 8 - 23 mg/dL 9  4  <5   Creatinine 0.45 - 1.24 mg/dL 4.09  8.11  9.14   BUN/Creat Ratio 10 - 24  6    Sodium 135 - 145 mmol/L 139  143  141   Potassium 3.5 - 5.1 mmol/L 4.0  4.8  3.6   Chloride 98 - 111 mmol/L 101  105  109   CO2 22 - 32 mmol/L 26  24  24    Calcium  8.9 - 10.3 mg/dL 78.2  9.6  8.5       Latest Ref Rng & Units 04/01/2024    5:03 PM 12/20/2023   12:04 PM 12/11/2023    9:18 AM  CBC  WBC 4.0 - 10.5 K/uL 9.1  6.5  5.2   Hemoglobin 13.0 - 17.0 g/dL 95.6  21.3  08.6   Hematocrit 39.0 - 52.0 % 42.5  35.8  34.7   Platelets 150 - 400 K/uL  216  246  301.0     Imaging: CT spine IMPRESSION: No acute fracture or static subluxation of the cervical spine  CTA head/ Neck w wo  IMPRESSION: 1. No emergent large vessel occlusion or high-grade stenosis of the intracranial arteries. 2. 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  MRI cervical spine wo contrast IMPRESSION: 1. No acute fracture or ligamentous injury of the cervical spine. 2. Moderate spinal canal stenosis at C6-7 and mild spinal canal stenosis at C3-4 and C5-6. 3. Multifocal hyperintense T2-weighted signal within the spinal cord, most notably at C3-4 and C6-7, likely myelomalacia. 4. Severe left C4, right C5, bilateral C6 and bilateral C7 neural foraminal stenosis.  EKG: sinus tachycardia, J-point elevation in leads V3-6.  ASSESSMENT & PLAN:   Assessment & Plan by Problem: Principal Problem:   Syncope  Mr. Sorbello is a 63 year old with PMH of duodenal ulcer and diverticula, HTN, and HLD who presents with fall concerning for syncope complicated by bilateral upper extremity paresthesia and weakness and admitted for syncope with paresthesias likely from cervical stenosis  on hospital day 0.  Syncope Fall Patient presents with syncope on Sunday morning. He did not have prodromal symptom prior to fall. Differentials include neurogenic versus cardiogenic. Following fall he had worsening UE paresthesias and subjective weakness. It sounds like he has had numbness in his RUE for several years. Prior CT cervical in January showed moderate to severe neural foraminal stenosis. No signs of compression of vertebral arteries on CT angio head/ neck in ED. Stenosis could lead to autonomic dysfunction with vagal nerve. From a cardiogenic standpoint, he has not prior history of cardiac dysfunction, no family history of HOCM and EKG is normal which makes this seem less likely. No murmur appreciated on cardiac exam. Without prodromal symptoms hypovolemic (orthostatic or from repeat GI bleed) is not likely and hemoglobin is improved from January.  -med telemetry -echo  -PT/OT  Severe neural foraminal stenosis Concerns for myelomalacia MRI with signs concerning for chronic compression from cervical stenosis. On exam he does have weakness in shoulder abduction and adduction with significant pain in shoulder muscles. He does not describe radiculopathy type pain. Endorses bilateral paresthesias that are new on the left and worsened on the right. NS APP talked with EDP and recommended leaving c-collar in place for now.  -Neurosurgery consulted and will see patient 5/27, may recommend Anterior Cervical Discectomy and fusion -scheduled tylenol  1000 mg q 6 hrs -robaxin 1500 tid for 3 days -oxycodone  5 mg PRN -no NSAIDS with ulcer history  H pylori gastritis History of diverticular bleed and duodenal ulcer Admitted 1/25 with acute GI bleed leading to syncope. EGD/ colonoscopy with gastritis, multiple diverticula and duodenal ulcer. Pathology with H pylori infection. He was prescribed quadruple therapy treatment however unclear if he completed this. He followed with with GI in April who  planned to test for clearance but I do not see that this was done. He is scheduled for repeat EGD 6/9. -restart pantoprazole  40 mg  Chronic medical conditions: Hypertension Medications include amlodipine  5 mg and hydrochlorothiazide  25 mg. He is adherent with medications and notes blood pressure is typically well controlled. He took amlodipine  and hydrochlorothiazide  5/26 AM -holding meds til after orthostatics.   Hyperlipidemia Does not take atorvastatin . Would revisit prior to discharge.  Tobacco use disorder Smokes a few daily but does not want nicotine patch while here.  Diet: Normal VTE: Enoxaparin IVF: None,None Code: Full  Prior to Admission Living Arrangement:  Home, living mother [name] Anticipated Discharge Location: to be determined pending neurosurgery evaluation Barriers to Discharge: syncope evaluation; neurosurgery evaluation  Dispo: Admit patient to Observation with expected length of stay less than 2 midnights.  Signed: Karalee Oscar, DO Internal Medicine Resident PGY-3  04/01/2024, 11:59 PM

## 2024-04-01 NOTE — ED Notes (Addendum)
 Returned to room from MRI. Reconnected to monitor.

## 2024-04-01 NOTE — Progress Notes (Signed)
 Pt presented to ED reporting an unexplained fall yesterday AM w/ increasing BUE pain, N/T. He is neurologically intact per EDP. Workup revealing congenitally narrow cervical canal w/ multiple foci of T2 hyperintensity. Medicine service to admit for syncopal workup. Recommend continuing cervical collar, conservative care/pain control. Formal consult to follow, anticipate discussion of ACDF to be performed within the coming week.   Aryia Delira CAYLIN Markeita Alicia, PA-C

## 2024-04-01 NOTE — H&P (Incomplete)
 Date: 04/01/2024               Patient Name:  Kyle Zamora MRN: [id number]  DOB: 21-Sep-1961 Age / Sex: 63 y.o., male   PCP: Nooruddin, Saad, MD         Medical Service: Internal Medicine Teaching Service         Attending Physician: Dr. Broadus Canes, Whitney Hams, MD      First Contact: [phone]    Second Contact: [phone]     SUBJECTIVE   Chief Complaint: fall  History of Present Illness:  [name] is a 63 year old with PMH of gastritis with ulcer, HTN, and HLD who presents with fall concerning for syncope complicated by bilateral upper extremity paresthesia and weakness.  On Sunday morning, he woke up and decided to go to the store, which is about 2 blocks from where he lives with his mother. When he was walking back, he fell and woke up on the ground. He denies tripping and does not remember what caused him to end up on the ground. He denies dizziness,light headedness, heart palpitations prior to fall. That morning he had taken his 2 blood pressure medication which he normally takes. He had not eaten breakfast yet. About 10 minutes after getting up to continue walking home, he started feeling worsening numbness in his right upper extremity and new numbness in his left upper extremity. He stayed at home the rest of the day but started to have worsening pain in his neck, shoulders and arms. His upper extremities started feeling increasingly weak leading him to come to the ED on Monday afternoon. In the past, he has had some numbness in his right hand but symptoms are more sever now and extend into arm/ shoulder.  He has history of syncope leading to fall in [place] and was admitted and treated for GI bleed. That time he recalls getting up from bed to use the bathroom and feeling very dizzy prior to passing out. Found to be hypotensive and hypothermic. Endoscopy/ colonoscopy at that time showed gastritis and multiple diverticula with small bleeding ulcer. Since that admission he denies additional  episodes of bright red blood in bowel movements.   He denies urinary or bowel incontinence, weakness to bilateral lower extremities, and saddle anesthesia.  ED Course: CBG 125 Hgb 13.8 Bilateral upper extremity paresthesias noted.  CT spine ok CT angio head- no emergent LVO  Neurosurgery consulted by EDP.  MRI cervical spine IMPRESSION: 1. No acute fracture or ligamentous injury of the cervical spine. 2. Moderate spinal canal stenosis at C6-7 and mild spinal canal stenosis at C3-4 and C5-6. 3. Multifocal hyperintense T2-weighted signal within the spinal cord, most notably at C3-4 and C6-7, likely myelomalacia. 4. Severe left C4, right C5, bilateral C6 and bilateral C7 neural foraminal stenosis.  Given fentanyl   Past Medical History Diverticulosis of colon HTN BPH  Past Surgical History:  Procedure Laterality Date  . BIOPSY  12/02/2023   Procedure: BIOPSY;  Surgeon: Annis Kinder, DO;  Location: [facility];  Service: Gastroenterology;;  . COLONOSCOPY WITH PROPOFOL  N/A 12/02/2023   Procedure: COLONOSCOPY WITH PROPOFOL ;  Surgeon: Annis Kinder, DO;  Location: [facility];  Service: Gastroenterology;  Laterality: N/A;  . ESOPHAGOGASTRODUODENOSCOPY (EGD) WITH PROPOFOL  N/A 12/02/2023   Procedure: ESOPHAGOGASTRODUODENOSCOPY (EGD) WITH PROPOFOL ;  Surgeon: Annis Kinder, DO;  Location: [facility];  Service: Gastroenterology;  Laterality: N/A;  . HEMOSTASIS CLIP PLACEMENT  12/02/2023   Procedure: HEMOSTASIS CLIP PLACEMENT;  Surgeon: Karene Oto,  Laquetta Plank, DO;  Location: [facility];  Service: Gastroenterology;;  . OPEN REDUCTION INTERNAL FIXATION (ORIF) DISTAL RADIAL FRACTURE Right 12/20/2019   Procedure: OPEN REDUCTION INTERNAL FIXATION (ORIF) RIGHT RADIUS SHAFT;  Surgeon: Rober Chimera, MD;  Location: [facility];  Service: Orthopedics;  Laterality: Right;  PRE-OP BLOCK  . OTHER SURGICAL HISTORY     "skull surgery as an infant"  . POLYPECTOMY   12/02/2023   Procedure: POLYPECTOMY;  Surgeon: Annis Kinder, DO;  Location: [facility];  Service: Gastroenterology;;    Meds:  Amlodipine  5 mg Hydrochlorothiazide  25 mg Tamsulosin  0.4 mg  Social:  Lives With: Occupation: Support: Level of Function: PCP: Internal Medicine clinic Substances:  Family History: no history of syncope  Allergies: Allergies as of 04/01/2024  . (No Known Allergies)    Review of Systems: A complete ROS was negative except as per HPI.   OBJECTIVE:   Physical Exam: Blood pressure (!) 146/88, pulse 81, temperature 98.6 F (37 C), temperature source Oral, resp. rate (!) 25, height 5\' 11"  (1.803 m), weight 72.6 kg, SpO2 97%.  Constitutional: well-appearing, C-collar in place, grimaces with repositioning  HENT: hematoma present to crown of head, bandage in place below c-collar Neck: c-collar in place Cardiovascular: regular rate and rhythm, no m/r/g Pulmonary/Chest: normal work of breathing on room air, lungs clear to auscultation bilaterally Abdominal: soft, non-tender, non-distended MSK: No gross abnormalities of bilateral shoulders, diffuse TTP along deltoid and trapezius bilaterally, no pain over clavicles of GH joint Neurological:  Mental Status: Patient is awake, alert, oriented x3 No signs of aphasia or neglect Motor: good effort thorughout, 4/5 in RUE and LUE abduction and adduction limited due to shoulder pain, 5/5 with bilateral flexion/extension at elbow, grip strength 5/5 bilaterally 5/5 strength in bilateral hip and knee flexion/ extension Sensory: Sensation is grossly intact to bilateral UE & LE  Labs:    Latest Ref Rng & Units 04/01/2024    5:03 PM 12/20/2023   12:04 PM 12/03/2023    6:41 AM  BMP  Glucose 70 - 99 mg/dL 161  97  096   BUN 8 - 23 mg/dL 9  4  <5   Creatinine 0.45 - 1.24 mg/dL 4.09  8.11  9.14   BUN/Creat Ratio 10 - 24  6    Sodium 135 - 145 mmol/L 139  143  141   Potassium 3.5 - 5.1 mmol/L 4.0  4.8  3.6    Chloride 98 - 111 mmol/L 101  105  109   CO2 22 - 32 mmol/L 26  24  24    Calcium  8.9 - 10.3 mg/dL 78.2  9.6  8.5       Latest Ref Rng & Units 04/01/2024    5:03 PM 12/20/2023   12:04 PM 12/11/2023    9:18 AM  CBC  WBC 4.0 - 10.5 K/uL 9.1  6.5  5.2   Hemoglobin 13.0 - 17.0 g/dL 95.6  21.3  08.6   Hematocrit 39.0 - 52.0 % 42.5  35.8  34.7   Platelets 150 - 400 K/uL 216  246  301.0     Imaging: CT spine IMPRESSION: No acute fracture or static subluxation of the cervical spine  CTA head/ Neck w wo  IMPRESSION: 1. No emergent large vessel occlusion or high-grade stenosis of the intracranial arteries. 2. 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  MRI cervical spine wo contrast IMPRESSION: 1. No acute fracture or ligamentous injury of the cervical spine. 2.  Moderate spinal canal stenosis at C6-7 and mild spinal canal stenosis at C3-4 and C5-6. 3. Multifocal hyperintense T2-weighted signal within the spinal cord, most notably at C3-4 and C6-7, likely myelomalacia. 4. Severe left C4, right C5, bilateral C6 and bilateral C7 neural foraminal stenosis.  EKG: sinus tachycardia, J-point elevaytion in leads V3-6.  ASSESSMENT & PLAN:   Assessment & Plan by Problem: Principal Problem:   Syncope  Mr. Mentzer is a 63 year old with PMH of gastritis with ulcer, HTN, and HLD who presents with fall concerning for syncope complicated by bilateral upper extremity paresthesia and weakness and admitted for syncope with paresthesias likely from cervical stenosis on hospital day 0.  Syncope  Severe neural foraminal stenosis Concerns for myelomalacia  Diet: {NAMES:3044014::"Normal","Heart Healthy","Carb-Modified","Renal","Carb/Renal","NPO","TPN","Tube Feeds"} VTE: {NAMES:3044014::"Heparin","Enoxaparin","SCDs","DOAC","None"} IVF: {NAMES:3044014::"None","NS","1/2  NS","LR","D5","D10"},{NAMES:3044014::"None","10cc/hr","25cc/hr","50cc/hr","75cc/hr","100cc/hr","110cc/hr","125cc/hr","Bolus"} Code: {NAMES:3044014::"Full","DNR","DNI","DNR/DNI","Comfort Care","Unknown"}  Prior to Admission Living Arrangement: {NAMES:3044014::"Home, living ***","SNF, ***","Homeless","***"} Anticipated Discharge Location: {NAMES:3044014::"Home","SNF","CIR","***"} Barriers to Discharge: ***  Dispo: Admit patient to Observation with expected length of stay less than 2 midnights.  Signed: Karalee Oscar, DO Internal Medicine Resident PGY-3  04/01/2024, 11:59 PM

## 2024-04-02 ENCOUNTER — Observation Stay (HOSPITAL_COMMUNITY)

## 2024-04-02 DIAGNOSIS — E876 Hypokalemia: Secondary | ICD-10-CM | POA: Diagnosis not present

## 2024-04-02 DIAGNOSIS — I10 Essential (primary) hypertension: Secondary | ICD-10-CM | POA: Diagnosis present

## 2024-04-02 DIAGNOSIS — M5412 Radiculopathy, cervical region: Secondary | ICD-10-CM | POA: Diagnosis present

## 2024-04-02 DIAGNOSIS — G9589 Other specified diseases of spinal cord: Secondary | ICD-10-CM | POA: Diagnosis present

## 2024-04-02 DIAGNOSIS — E785 Hyperlipidemia, unspecified: Secondary | ICD-10-CM | POA: Diagnosis present

## 2024-04-02 DIAGNOSIS — E861 Hypovolemia: Secondary | ICD-10-CM | POA: Diagnosis present

## 2024-04-02 DIAGNOSIS — M4802 Spinal stenosis, cervical region: Secondary | ICD-10-CM | POA: Diagnosis present

## 2024-04-02 DIAGNOSIS — Z79899 Other long term (current) drug therapy: Secondary | ICD-10-CM | POA: Diagnosis not present

## 2024-04-02 DIAGNOSIS — R55 Syncope and collapse: Secondary | ICD-10-CM

## 2024-04-02 DIAGNOSIS — F1410 Cocaine abuse, uncomplicated: Secondary | ICD-10-CM | POA: Diagnosis present

## 2024-04-02 DIAGNOSIS — E860 Dehydration: Secondary | ICD-10-CM | POA: Diagnosis present

## 2024-04-02 DIAGNOSIS — I951 Orthostatic hypotension: Secondary | ICD-10-CM | POA: Diagnosis present

## 2024-04-02 DIAGNOSIS — F17210 Nicotine dependence, cigarettes, uncomplicated: Secondary | ICD-10-CM | POA: Diagnosis present

## 2024-04-02 DIAGNOSIS — N400 Enlarged prostate without lower urinary tract symptoms: Secondary | ICD-10-CM | POA: Diagnosis present

## 2024-04-02 LAB — BASIC METABOLIC PANEL WITH GFR
Anion gap: 7 (ref 5–15)
BUN: 6 mg/dL — ABNORMAL LOW (ref 8–23)
CO2: 27 mmol/L (ref 22–32)
Calcium: 9 mg/dL (ref 8.9–10.3)
Chloride: 102 mmol/L (ref 98–111)
Creatinine, Ser: 0.74 mg/dL (ref 0.61–1.24)
GFR, Estimated: >60 mL/min (ref >60)
Glucose, Bld: 117 mg/dL — ABNORMAL HIGH (ref 70–99)
Potassium: 3.4 mmol/L — ABNORMAL LOW (ref 3.5–5.1)
Sodium: 136 mmol/L (ref 135–145)

## 2024-04-02 LAB — CBC
HCT: 41.6 % (ref 39.0–52.0)
Hemoglobin: 13.5 g/dL (ref 13.0–17.0)
MCH: 27.9 pg (ref 26.0–34.0)
MCHC: 32.5 g/dL (ref 30.0–36.0)
MCV: 86 fL (ref 80.0–100.0)
Platelets: 177 10*3/uL (ref 150–400)
RBC: 4.84 MIL/uL (ref 4.22–5.81)
RDW: 16.5 % — ABNORMAL HIGH (ref 11.5–15.5)
WBC: 7 10*3/uL (ref 4.0–10.5)
nRBC: 0 % (ref 0.0–0.2)

## 2024-04-02 LAB — ECHOCARDIOGRAM COMPLETE
AR max vel: 3.27 cm2
AV Peak grad: 6.6 mmHg
Ao pk vel: 1.28 m/s
Area-P 1/2: 2.46 cm2
Height: 71 in
MV VTI: 3.4 cm2
S' Lateral: 2.6 cm
Weight: 2560.86 [oz_av]

## 2024-04-02 LAB — RAPID URINE DRUG SCREEN, HOSP PERFORMED
Amphetamines: NOT DETECTED
Barbiturates: NOT DETECTED
Benzodiazepines: NOT DETECTED
Cocaine: POSITIVE — AB
Opiates: NOT DETECTED
Tetrahydrocannabinol: NOT DETECTED

## 2024-04-02 LAB — MAGNESIUM: Magnesium: 2 mg/dL (ref 1.7–2.4)

## 2024-04-02 LAB — HIV ANTIBODY (ROUTINE TESTING W REFLEX): HIV Screen 4th Generation wRfx: NONREACTIVE

## 2024-04-02 MED ORDER — POTASSIUM CHLORIDE CRYS ER 20 MEQ PO TBCR
20.0000 meq | EXTENDED_RELEASE_TABLET | Freq: Once | ORAL | Status: AC
  Administered 2024-04-02: 20 meq via ORAL
  Filled 2024-04-02: qty 1

## 2024-04-02 MED ORDER — PANTOPRAZOLE SODIUM 40 MG PO TBEC
40.0000 mg | DELAYED_RELEASE_TABLET | Freq: Every day | ORAL | Status: DC
  Administered 2024-04-02 – 2024-04-04 (×3): 40 mg via ORAL
  Filled 2024-04-02 (×3): qty 1

## 2024-04-02 MED ORDER — LACTATED RINGERS IV BOLUS
1000.0000 mL | Freq: Once | INTRAVENOUS | Status: AC
  Administered 2024-04-02: 1000 mL via INTRAVENOUS

## 2024-04-02 NOTE — Consult Note (Signed)
 Chief Complaint   Chief Complaint  Patient presents with   Fall   Head Injury    History of Present Illness  Kyle Zamora is a 63 y.o. male presenting to the hospital after a fall at home Sunday night.  Patient says he blacked out, does not remember how he fell, remembers waking up on the floor.  He was complaining of severe right greater than left arm pain radiating from the shoulder down to the arm, forearm, and into the hand.  He is also complaining of more left-sided shoulder pain.  At this point he complains primarily of arm pain, does not complain of significant paresthesias in the hands.  He does not complain of any lower extremity symptoms.  He does not really report any significant upper extremity weakness.  Of note, the patient reports a history of hypertension.  No history of diabetes.  No known heart disease or previous stroke.  He does not have any cancer history.  He is not on any blood thinners or antiplatelet agents.  He does smoke cigarettes cigarettes.  Past Medical History   Past Medical History:  Diagnosis Date   Benign prostate hyperplasia    Hypertension     Past Surgical History   Past Surgical History:  Procedure Laterality Date   BIOPSY  12/02/2023   Procedure: BIOPSY;  Surgeon: Annis Kinder, DO;  [facility];  Service: Gastroenterology;;   COLONOSCOPY WITH PROPOFOL  N/A 12/02/2023   Procedure: COLONOSCOPY WITH PROPOFOL ;  Surgeon: Annis Kinder, DO;  [facility]: [facility];  Service: Gastroenterology;  Laterality: N/A;   ESOPHAGOGASTRODUODENOSCOPY (EGD) WITH PROPOFOL  N/A 12/02/2023   Procedure: ESOPHAGOGASTRODUODENOSCOPY (EGD) WITH PROPOFOL ;  Surgeon: Annis Kinder, DO;  Location: [facility];  Service: Gastroenterology;  Laterality: N/A;   HEMOSTASIS CLIP PLACEMENT  12/02/2023   Procedure: HEMOSTASIS CLIP PLACEMENT;  Surgeon: Annis Kinder, DO;  [facility]: [facility];  Service: Gastroenterology;;   OPEN REDUCTION INTERNAL  FIXATION (ORIF) DISTAL RADIAL FRACTURE Right 12/20/2019   Procedure: OPEN REDUCTION INTERNAL FIXATION (ORIF) RIGHT RADIUS SHAFT;  Surgeon: Rober Chimera, MD;  Location: [facility];  Service: Orthopedics;  Laterality: Right;  PRE-OP BLOCK   OTHER SURGICAL HISTORY     "skull surgery as an infant"   POLYPECTOMY  12/02/2023   Procedure: POLYPECTOMY;  Surgeon: Annis Kinder, DO;  Location: [facility];  Service: Gastroenterology;;    Social History   Social History   Tobacco Use   Smoking status: Every Day    Types: Cigarettes   Smokeless tobacco: Never   Tobacco comments:    Smokes when he drinks alcohol, 45 years   Vaping Use   Vaping status: Never Used  Substance Use Topics   Alcohol use: Yes    Alcohol/week: 12.0 standard drinks of alcohol    Types: 12 Standard drinks or equivalent per week    Comment: 2-3 12 ox beers once a week and 12 pack on weekends   Drug use: No    Medications   Prior to Admission medications   Medication Sig Start Date End Date Taking? Authorizing Provider  amLODipine  (NORVASC ) 5 MG tablet Take 1 tablet (5 mg total) by mouth daily. 12/20/23  Yes Marni Sins, MD  hydrochlorothiazide  (HYDRODIURIL ) 25 MG tablet Take 1 tablet (25 mg total) by mouth daily. 12/20/23  Yes Marni Sins, MD  Omega-3 Fatty Acids (FISH OIL OMEGA-3 PO) Take 1 capsule by mouth in the morning and at bedtime. Dosage unknown   Yes  [provider]  saccharomyces boulardii (FLORASTOR) 250 MG capsule Take 250 mg by mouth 2 (two) times daily.   Yes [provider]  tamsulosin  (FLOMAX ) 0.4 MG CAPS capsule Take 1 capsule (0.4 mg total) by mouth daily. 12/20/23  Yes Marni Sins, MD    Allergies  No Known Allergies  Review of Systems  ROS  Neurologic Exam  Awake, alert, oriented Memory and concentration grossly intact Speech fluent, appropriate CN grossly intact Motor exam: Upper Extremities Deltoid Bicep Tricep Grip  Right 5/5 5/5 5/5  5/5  Left 5/5 5/5 5/5 5/5   Lower Extremities IP Quad PF DF EHL  Right 5/5 5/5 5/5 5/5 5/5  Left 5/5 5/5 5/5 5/5 5/5   Sensation grossly intact to LT  Imaging  MRI of the cervical spine was personally reviewed.  This demonstrates evidence of multilevel cervical spondylosis.  Specifically, there is perhaps some mild central stenosis at C3-4 and C5-6, with moderate stenosis at C6-7 and focal signal change at this level.  There is also varying degrees of moderate to severe neuroforaminal stenosis throughout the cervical spine.  Impression  - 63 y.o. male with a syncopal episode and fall, and symptoms of mild central cord syndrome but no significant motor weakness. His imaging reveals moderate stenosis at C6-7 however given his relatively good neurologic function I would not recommend acute operative decompression.  Plan  - Cont with PT/OT - Will need syncopal w/u per primary service - Can f/u in outpatient clinic in 2-3 weeks.   I have reviewed the situation including imaging findings with the patient and his mother. Given his good condition I did recommend conservative treatment for now with outpatient follow-up. All their questions were answered.  Augusto Blonder, MD [facility]

## 2024-04-02 NOTE — Evaluation (Signed)
 Occupational Therapy Evaluation Patient Details Name: Kyle Zamora MRN: [id number] DOB: Nov 18, 1960 Today's Date: 04/02/2024   History of Present Illness   Pt is a 63 y.o. male presenting [facility] ED following a fall with concerns of syncope, complicated by bilateral UE paresthesia and weakness. Imaging shows moderate spinal canal stenosis at C6-7 and mild spinal canal     Clinical Impressions Patient admitted for the diagnosis above.  PTA he lives at home with his mother, who can provide supportive assist as needed.  Patient normally walks without an AD, and needed no assist for ADL and iADL completion.  Currently he presents with bilateral upper extremity sensory changes and slowed/purposeful AROM.  Patient is able to use his UE's functionally, and is needing generalized supervision for in room mobility/toileting.  OT will continue efforts in the acute setting to address deficits, and will most likely need to defer to neurosurgery if outpatient strengthening is recommended.       If plan is discharge home, recommend the following:   Assist for transportation     Functional Status Assessment   Patient has had a recent decline in their functional status and demonstrates the ability to make significant improvements in function in a reasonable and predictable amount of time.     Equipment Recommendations   Tub/shower bench     Recommendations for Other Services         Precautions/Restrictions   Precautions Precautions: Fall;Cervical Recall of Precautions/Restrictions: Intact Required Braces or Orthoses: Cervical Brace Cervical Brace: Hard collar Restrictions Weight Bearing Restrictions Per Provider Order: No     Mobility Bed Mobility Overal bed mobility: Modified Independent                  Transfers Overall transfer level: Needs assistance   Transfers: Sit to/from Stand Sit to Stand: Supervision                  Balance Overall balance  assessment: Needs assistance Sitting-balance support: Feet supported Sitting balance-Leahy Scale: Good     Standing balance support: No upper extremity supported Standing balance-Leahy Scale: Fair                             ADL either performed or assessed with clinical judgement   ADL       Grooming: Supervision/safety;Standing               Lower Body Dressing: Supervision/safety;Sit to/from stand   Toilet Transfer: Retail banker;Ambulation                   Vision Baseline Vision/History: 1 Wears glasses Patient Visual Report: No change from baseline       Perception Perception: Within Functional Limits       Praxis Praxis: WFL       Pertinent Vitals/Pain Pain Assessment Pain Assessment: Faces Faces Pain Scale: Hurts even more Pain Location: bilateral upper extremities and cervical spine, L shouder more painful than R Pain Descriptors / Indicators: Constant, Pins and needles, Sharp, Grimacing Pain Intervention(s): Premedicated before session     Extremity/Trunk Assessment Upper Extremity Assessment Upper Extremity Assessment: Generalized weakness;Right hand dominant;RUE deficits/detail;LUE deficits/detail RUE Deficits / Details: full AROM, movements are slow and purposeful.  Functional strength. RUE Sensation: decreased light touch RUE Coordination: WNL LUE Deficits / Details: full AROM, movements are slow and purposeful.  Functional strength. LUE Sensation: decreased light touch LUE Coordination: WNL   Lower  Extremity Assessment Lower Extremity Assessment: Defer to PT evaluation   Cervical / Trunk Assessment Cervical / Trunk Assessment: Normal   Communication Communication Communication: No apparent difficulties   Cognition Arousal: Alert Behavior During Therapy: WFL for tasks assessed/performed Cognition: No apparent impairments                               Following commands: Intact        Cueing  General Comments   Cueing Techniques: Verbal cues;Visual cues  supine BP: 119/76, seated BP: 123/82, following session BP: 143/78. Pt denies any dizziness or lightheadedness throughout session.   Exercises     Shoulder Instructions      Home Living Family/patient expects to be discharged to:: Private residence Living Arrangements: Parent Available Help at Discharge: Family;Available PRN/intermittently Type of Home: Apartment Home Access: Level entry     Home Layout: One level     Bathroom Shower/Tub: Chief Strategy Officer: Standard     Home Equipment: None          Prior Functioning/Environment Prior Level of Function : Independent/Modified Independent             Mobility Comments: pt reports independent with all mobilization, not utilizing an AD. ADLs Comments: pt reports independent with all ADLs    OT Problem List: Decreased strength;Impaired balance (sitting and/or standing)   OT Treatment/Interventions: Self-care/ADL training;Therapeutic activities;Patient/family education;DME and/or AE instruction;Balance training      OT Goals(Current goals can be found in the care plan section)   Acute Rehab OT Goals Patient Stated Goal: (P) Return home OT Goal Formulation: (P) With patient Time For Goal Achievement: (P) 04/16/24 Potential to Achieve Goals: (P) Good ADL Goals Pt Will Perform Grooming: Independently;standing Pt Will Perform Upper Body Dressing: sitting;standing;Independently Pt Will Perform Lower Body Dressing: Independently;sit to/from stand Pt Will Transfer to Toilet: Independently;ambulating;regular height toilet   OT Frequency:  Min 2X/week    Co-evaluation              AM-PAC OT "6 Clicks" Daily Activity     Outcome Measure Help from another person eating meals?: None Help from another person taking care of personal grooming?: None Help from another person toileting, which includes using toliet,  bedpan, or urinal?: A Little Help from another person bathing (including washing, rinsing, drying)?: A Little Help from another person to put on and taking off regular upper body clothing?: None Help from another person to put on and taking off regular lower body clothing?: A Little 6 Click Score: 21   End of Session Equipment Utilized During Treatment: Gait belt Nurse Communication: Mobility status  Activity Tolerance: Patient tolerated treatment well Patient left: in bed;with call bell/phone within reach;with family/visitor present  OT Visit Diagnosis: Unsteadiness on feet (R26.81);Muscle weakness (generalized) (M62.81)                Time: 9604-5409 OT Time Calculation (min): 20 min Charges:  OT General Charges $OT Visit: 1 Visit OT Evaluation $OT Eval Moderate Complexity: 1 Mod  04/02/2024  RP, OTR/L  Acute Rehabilitation Services  Office:  [phone]   Benjamen Brand 04/02/2024, 2:32 PM

## 2024-04-02 NOTE — TOC CAGE-AID Note (Signed)
 Transition of Care [facility]) - CAGE-AID Screening   Patient Details  Name: Kyle Zamora MRN: [id number] Date of Birth: 1961-03-18  [facility]) CM/SW Contact:    Jene Oravec E Nathalya Wolanski, LCSW Phone Number: 04/02/2024, 1:23 PM   Clinical Narrative: Patient states he drinks when he feels like it, that it depends on the day. Patient states he uses cocaine but plans to quit. Patient states he plans to rely on his faith to help him quit. He refused SA resources.   CAGE-AID Screening:    Have You Ever Felt You Ought to Cut Down on Your Drinking or Drug Use?: Yes Have People Annoyed You By Critizing Your Drinking Or Drug Use?: No Have You Felt Bad Or Guilty About Your Drinking Or Drug Use?: Yes Have You Ever Had a Drink or Used Drugs First Thing In The Morning to Steady Your Nerves or to Get Rid of a Hangover?: No CAGE-AID Score: 2  Substance Abuse Education Offered: Yes

## 2024-04-02 NOTE — Progress Notes (Signed)
 Fall/ UE weakness, syncope   04/02/24 1621  TOC Brief Assessment  Insurance and Status Reviewed  Patient has primary care physician Yes  Home environment has been reviewed Resides with mom.  Prior level of function: PTA independent with ADL's, no DME usage.  Prior/Current Home Services No current home services  Social Drivers of Health Review SDOH reviewed no interventions necessary  Readmission risk has been reviewed No  Transition of care needs no transition of care needs at this time   Therapy recommending home health PT services.  Pt agreeable. Slim chance securing HH services, NCM will search... DME need noted  RW and TUB Bench. Referral made with [employer]/ [employer] for DME. Equipment will be delivered to bedside prior to d/c.   [facility] team following and will continue assisting with needs.Aaron AasAaron Aas

## 2024-04-02 NOTE — Progress Notes (Cosign Needed Addendum)
 Subjective Continued neck and bilateral arm/hand pain but pain meds have helped significantly. No lightheadedness/dizziness since admission.   Physical exam Blood pressure 126/75, pulse 79, temperature (!) 97.4 F (36.3 C), temperature source Oral, resp. rate 17, height 5\' 11"  (1.803 m), weight 72.6 kg, SpO2 99%.  Constitutional: lying in bed with cervical collar, in no acute distress HENT:  4 cm healing laceration with surrounding abrasion/swelling on crown, non-draining/non-bleeding Cardiovascular: regular rate and rhythm, no m/r/g, no LEE Pulmonary/Chest: normal work of breathing on room air, lungs clear to auscultation bilaterally MSK: limited exam from pain, but able to lift bilateral arms to shoulder height, L>R TTP in hands Neurological: alert & oriented Skin: warm and dry Psych: normal mood and behavior   Weight change:   No intake or output data in the 24 hours ending 04/02/24 1116 Net IO Since Admission: No IO data has been entered for this period [04/02/24 1116]  Labs, images, and other studies    Latest Ref Rng & Units 04/02/2024    5:19 AM 04/01/2024    5:03 PM 12/20/2023   12:04 PM  CBC  WBC 4.0 - 10.5 K/uL 7.0  9.1  6.5   Hemoglobin 13.0 - 17.0 g/dL 57.8  46.9  62.9   Hematocrit 39.0 - 52.0 % 41.6  42.5  35.8   Platelets 150 - 400 K/uL 177  216  246        Latest Ref Rng & Units 04/02/2024    5:19 AM 04/01/2024    5:03 PM 12/20/2023   12:04 PM  BMP  Glucose 70 - 99 mg/dL 528  413  97   BUN 8 - 23 mg/dL 6  9  4    Creatinine 0.61 - 1.24 mg/dL 2.44  0.10  2.72   BUN/Creat Ratio 10 - 24   6   Sodium 135 - 145 mmol/L 136  139  143   Potassium 3.5 - 5.1 mmol/L 3.4  4.0  4.8   Chloride 98 - 111 mmol/L 102  101  105   CO2 22 - 32 mmol/L 27  26  24    Calcium  8.9 - 10.3 mg/dL 9.0  53.6  9.6      Assessment and plan Hospital day 1  Kyle Zamora is a 63 y.o.male with history of duodenal ulcer and diverticula, HTN, cocaine use disorder who  presents with fall concerning for syncope complicated by bilateral upper extremity paresthesia and weakness and admitted for syncope with paresthesias likely from cervical stenosis on hospital day 1.   Syncope Fall Etiology remains unclear but he endorsed staying up drinking/using cocaine two nights before the syncopal episode. He was orthostatic today so continuing to hold anti-hypertensives. TTE is pending. PT/OT also pending. Labs today are unremarkable. Will give 1 L LR bolus and repeat orthostatics.  -Follow-up TTE results -Monitor telemetry -Counsel on importance of abstaining of cocaine and limiting alcohol intake  Severe neural foraminal stenosis Concerns for myelomalacia No new deficits today. Pain continues but is much improved on below regimen. -Neurosurgery consulted and will see patient 5/27, may recommend Anterior Cervical Discectomy and fusion -scheduled tylenol  1000 mg q 6 hrs -robaxin 1500 tid for 3 days -oxycodone  5 mg PRN -no NSAIDS with ulcer history   History of diverticular bleed and duodenal ulcer -Continue home pantoprazole  40 mg   Chronic medical conditions: Hypertension Orthostatic today. Will continue to hold Amlodipine  and hydrochlorothiazide   as he is normotensive.   BPH -Resume Flomax    Tobacco use disorder Smokes a few daily but does not want nicotine patch while here.  Ronni Colace, DO 04/02/2024, 11:16 AM  Pager: [phone] After 5pm or weekend: [phone]

## 2024-04-02 NOTE — Progress Notes (Signed)
    Durable Medical Equipment  (From admission, onward)           Start     Ordered   04/02/24 1619  For home use only DME Walker rolling  Once       Question Answer Comment  Walker: With 5 Inch Wheels   Patient needs a walker to treat with the following condition Gait instability      04/02/24 1618   04/02/24 1619  For home use only DME Tub bench  Once        04/02/24 1618

## 2024-04-02 NOTE — Evaluation (Signed)
 Physical Therapy Evaluation Patient Details Name: Kyle Zamora MRN: [id number] DOB: [birth year] Today's Date: 04/02/2024  History of Present Illness  Pt is a 63 y.o. male presenting to [facility] ED following a fall with concerns of syncope, complicated by bilateral UE paresthesia and weakness. Imaging shows moderate spinal canal stenosis at C6-7 and mild spinal canal  Clinical Impression  Pt presents to evaluation with decreased mobility, decreased range of motion, decreased strength, impaired balance, and pain, all limiting patient's ability to safely mobilize near baseline level of function. Prior to admittance, pt reports independent with all mobilization, not utilizing an AD. Pt reports significant pain with light touch to left upper extremity and no reports of deficits in right upper extremity. Pt was able to ambulate 100' w/ RW and no physical assistance; no reports of dizziness or lightheadedness throughout session. Pt would benefit from further gait training, and trialing SPC for stability. PT will continue to treat patient while he is admitted. Recommending HHPT upon discharge to address remaining mobility deficits and optimize return to PLOF.         If plan is discharge home, recommend the following: A little help with walking and/or transfers;A little help with bathing/dressing/bathroom;Assist for transportation   Can travel by private vehicle        Equipment Recommendations Rolling walker (2 wheels)  Recommendations for Other Services       Functional Status Assessment Patient has had a recent decline in their functional status and demonstrates the ability to make significant improvements in function in a reasonable and predictable amount of time.     Precautions / Restrictions Precautions Precautions: Fall;Cervical Precaution Booklet Issued: No Recall of Precautions/Restrictions: Intact Required Braces or Orthoses: Cervical Brace Cervical Brace: Hard  collar Restrictions Weight Bearing Restrictions Per Provider Order: No      Mobility  Bed Mobility Overal bed mobility: Modified Independent             General bed mobility comments: pt performed bed mobility with HOB elevated and used railings w/ supervision    Transfers Overall transfer level: Needs assistance Equipment used: Rolling walker (2 wheels) Transfers: Sit to/from Stand Sit to Stand: Contact guard assist           General transfer comment: Pt completed STS from EOB w/ RW and CGA; increased time and effort to complete.    Ambulation/Gait Ambulation/Gait assistance: Contact guard assist Gait Distance (Feet): 100 Feet Assistive device: Rolling walker (2 wheels) Gait Pattern/deviations: Step-through pattern, Decreased stride length, Trunk flexed Gait velocity: reduced Gait velocity interpretation: <1.8 ft/sec, indicate of risk for recurrent falls   General Gait Details: Pt ambulated 10' x2 trials without an AD and CGA > 10' w/ RW and CGA > 75' w/ RW and CGA. Pt ambulates with decreased cadence, decreased step length, and reports feeling unstable when mobilizing without AD; no losses of balance were noted.  Stairs            Wheelchair Mobility     Tilt Bed    Modified Rankin (Stroke Patients Only)       Balance Overall balance assessment: Needs assistance Sitting-balance support: No upper extremity supported, Feet supported Sitting balance-Leahy Scale: Good     Standing balance support: No upper extremity supported, During functional activity Standing balance-Leahy Scale: Fair Standing balance comment: Pt is able to stand at bedside commode without upper extremity support and supervision.  Pertinent Vitals/Pain Pain Assessment Pain Assessment: 0-10 Pain Score: 8  Pain Location: bilateral upper extremities and cervical spine Pain Descriptors / Indicators: Constant, Pins and needles, Sharp,  Grimacing Pain Intervention(s): Limited activity within patient's tolerance, Monitored during session (pt reports pain as 5/10 at end of session.)    Home Living Family/patient expects to be discharged to:: Private residence Living Arrangements: Parent Available Help at Discharge: Family;Available PRN/intermittently Type of Home: Apartment Home Access: Level entry       Home Layout: One level Home Equipment: None      Prior Function Prior Level of Function : Independent/Modified Independent             Mobility Comments: pt reports independent with all mobilization, not utilizing an AD. ADLs Comments: pt reports independent with all ADLs     Extremity/Trunk Assessment   Upper Extremity Assessment Upper Extremity Assessment: Generalized weakness;Right hand dominant;RUE deficits/detail;LUE deficits/detail RUE Deficits / Details: full AROM, movements are slow and purposeful.  Functional strength. RUE Sensation: decreased light touch RUE Coordination: WNL LUE Deficits / Details: full AROM, movements are slow and purposeful.  Functional strength. LUE Sensation: decreased light touch LUE Coordination: WNL    Lower Extremity Assessment Lower Extremity Assessment: Defer to PT evaluation    Cervical / Trunk Assessment Cervical / Trunk Assessment: Normal  Communication   Communication Communication: No apparent difficulties    Cognition Arousal: Alert Behavior During Therapy: WFL for tasks assessed/performed   PT - Cognitive impairments: No apparent impairments                         Following commands: Intact       Cueing Cueing Techniques: Verbal cues, Visual cues     General Comments General comments (skin integrity, edema, etc.): supine BP: 119/76, seated BP: 123/82, following session BP: 143/78. Pt denies any dizziness or lightheadedness throughout session.    Exercises     Assessment/Plan    PT Assessment Patient needs continued PT services   PT Problem List Decreased strength;Decreased range of motion;Decreased activity tolerance;Decreased balance;Decreased mobility;Pain       PT Treatment Interventions DME instruction;Gait training;Functional mobility training;Therapeutic activities;Therapeutic exercise;Balance training;Neuromuscular re-education;Patient/family education    PT Goals (Current goals can be found in the Care Plan section)  Acute Rehab PT Goals Patient Stated Goal: to return to prior level of function PT Goal Formulation: With patient Time For Goal Achievement: 04/16/24 Potential to Achieve Goals: Good    Frequency Min 2X/week     Co-evaluation               AM-PAC PT "6 Clicks" Mobility  Outcome Measure Help needed turning from your back to your side while in a flat bed without using bedrails?: A Little Help needed moving from lying on your back to sitting on the side of a flat bed without using bedrails?: A Little Help needed moving to and from a bed to a chair (including a wheelchair)?: A Little Help needed standing up from a chair using your arms (e.g., wheelchair or bedside chair)?: A Little Help needed to walk in hospital room?: A Little Help needed climbing 3-5 steps with a railing? : A Little 6 Click Score: 18    End of Session Equipment Utilized During Treatment: Gait belt Activity Tolerance: Patient tolerated treatment well Patient left: in bed;with call bell/phone within reach;with bed alarm set;with family/visitor present Nurse Communication: Mobility status PT Visit Diagnosis: Unsteadiness on feet (R26.81);Repeated falls (R29.6);Muscle  weakness (generalized) (M62.81);Other (comment) (syncope)    Time: 1610-9604 PT Time Calculation (min) (ACUTE ONLY): 38 min   Charges:   PT Evaluation $PT Eval Low Complexity: 1 Low   PT General Charges $$ ACUTE PT VISIT: 1 Visit         Lonell Rives, SPT Acute Rehab [phone]   Lonell Rives 04/02/2024, 3:10 PM

## 2024-04-03 DIAGNOSIS — R55 Syncope and collapse: Secondary | ICD-10-CM | POA: Diagnosis not present

## 2024-04-03 LAB — BASIC METABOLIC PANEL WITH GFR
Anion gap: 4 — ABNORMAL LOW (ref 5–15)
BUN: 5 mg/dL — ABNORMAL LOW (ref 8–23)
CO2: 28 mmol/L (ref 22–32)
Calcium: 9.1 mg/dL (ref 8.9–10.3)
Chloride: 104 mmol/L (ref 98–111)
Creatinine, Ser: 0.66 mg/dL (ref 0.61–1.24)
GFR, Estimated: >60 mL/min (ref >60)
Glucose, Bld: 108 mg/dL — ABNORMAL HIGH (ref 70–99)
Potassium: 4 mmol/L (ref 3.5–5.1)
Sodium: 136 mmol/L (ref 135–145)

## 2024-04-03 MED ORDER — LACTATED RINGERS IV BOLUS
1000.0000 mL | Freq: Once | INTRAVENOUS | Status: AC
  Administered 2024-04-03: 1000 mL via INTRAVENOUS

## 2024-04-03 MED ORDER — GABAPENTIN 100 MG PO CAPS
100.0000 mg | ORAL_CAPSULE | Freq: Three times a day (TID) | ORAL | Status: DC
  Administered 2024-04-03 – 2024-04-04 (×4): 100 mg via ORAL
  Filled 2024-04-03 (×4): qty 1

## 2024-04-03 NOTE — Progress Notes (Signed)
   04/03/24 1000  Mobility  Activity Ambulated with assistance in hallway  Level of Assistance Standby assist, set-up cues, supervision of patient - no hands on  Assistive Device Front wheel walker  Distance Ambulated (ft) 250 ft  Activity Response Tolerated well  Mobility Referral Yes  Mobility visit 1 Mobility  Mobility Specialist Start Time (ACUTE ONLY) 0959  Mobility Specialist Stop Time (ACUTE ONLY) 1006  Mobility Specialist Time Calculation (min) (ACUTE ONLY) 7 min   Pt received in bed and agreeable. Required no physical assistance throughout. Asymptomatic w/ no complaints. Denied any dizziness or lightheadedness. Pt left on EOB with call bell and all needs met.  D'Vante Nolon Baxter Mobility Specialist Please contact via Special educational needs teacher or Rehab office at [phone]

## 2024-04-03 NOTE — Progress Notes (Signed)
 Subjective Continued but improved pain in bilateral L > R arms/hands. R index finger tingling. Denies chest pain/palpations, lightheadedness, dizziness, presyncope.  Physical exam Blood pressure 123/73, pulse 74, temperature 99 F (37.2 C), temperature source Oral, resp. rate 17, height 5\' 11"  (1.803 m), weight 72.6 kg, SpO2 100%.  Constitutional: lying in bed with cervical collar, in no acute distress Cardiovascular: regular rate and rhythm, no m/r/g, no LEE Pulmonary/Chest: normal work of breathing on room air, lungs clear to auscultation bilaterally MSK: 5/5 strength bilateral UE, no bicipital tendonitis, reduced sensation to light touch in right index finger, no issues with abduction, external, or internal rotation Neurological: alert & oriented Skin: warm and dry Psych: normal mood and behavior    Weight change:    Intake/Output Summary (Last 24 hours) at 04/03/2024 1347 Last data filed at 04/03/2024 1340 Gross per 24 hour  Intake 1440 ml  Output 300 ml  Net 1140 ml   Net IO Since Admission: 1,140 mL [04/03/24 1347]  Labs, images, and other studies    Latest Ref Rng & Units 04/02/2024    5:19 AM 04/01/2024    5:03 PM 12/20/2023   12:04 PM  CBC  WBC 4.0 - 10.5 K/uL 7.0  9.1  6.5   Hemoglobin 13.0 - 17.0 g/dL 16.1  09.6  04.5   Hematocrit 39.0 - 52.0 % 41.6  42.5  35.8   Platelets 150 - 400 K/uL 177  216  246        Latest Ref Rng & Units 04/03/2024    5:08 AM 04/02/2024    5:19 AM 04/01/2024    5:03 PM  BMP  Glucose 70 - 99 mg/dL 409  811  914   BUN 8 - 23 mg/dL 5  6  9    Creatinine 0.61 - 1.24 mg/dL 7.82  9.56  2.13   Sodium 135 - 145 mmol/L 136  136  139   Potassium 3.5 - 5.1 mmol/L 4.0  3.4  4.0   Chloride 98 - 111 mmol/L 104  102  101   CO2 22 - 32 mmol/L 28  27  26    Calcium  8.9 - 10.3 mg/dL 9.1  9.0  08.6      Assessment and plan Hospital day 2  Kyle Zamora is a 63 y.o.male with history of duodenal ulcer and diverticula, HTN,  cocaine use disorder who presents with fall concerning for syncope complicated by bilateral upper extremity paresthesia and weakness and admitted for syncope with paresthesias likely from cervical stenosis.   Syncope Story and work-up thus far consistent with orthostatic syncope 2/2 hypovolemia in the setting of recent alcohol/cocaine use, reduced po intake with continued use of diuretic. He has been orthostatic since admission. Received LR bolus yesterday and remained orthostatic so getting additional 1 L bolus today. TTE 5/27 was unremarkable. Telemetry showed ST elevation this morning so ordered EKG which showed diffuse STE. He is having no chest pain and no rub noted on cardiac auscultation. Review of previous EKG's show similar pattern. Curbsided cardiology who agree that this is likely an early re-polarization variant which may be related to cocaine use. No further workup needed at this time. Continuing to hold antihypertensives and anticipate holding hydrochlorothiazide  upon discharge. Follow-[facility] with [facility] has been arranged for 6/4.  -Repeat orthostatics  -Monitor telemetry -Encourage po intake   Severe neural foraminal stenosis Concerns for myelomalacia Symptoms consistent with known  stenosis further complicated by recent fall. Neurosurgery consulted and do not see a need for procedure while inpatient. Will follow-up outpatient.  -Reached out to Neurosurgery regarding plan for C-collar, follow-up, and any reservations they might have with upcoming endoscopy for gastritis surveillance he has scheduled on 6/9, waiting to hear back. -Added Gabapentin  100 mg TID today given radicular symptoms -scheduled tylenol  1000 mg q 6 hrs -robaxin  1500 tid for 3 days -oxycodone  5 mg PRN -no NSAIDS with ulcer history   History of diverticular bleed and duodenal ulcer -Continue home pantoprazole  40 mg   Chronic medical conditions: Hypertension Orthostatic today. Normotensive otherwise. Giving additional  bolus per above and will continue to hold Amlodipine  and hydrochlorothiazide .   BPH -Continue Flomax , consider holding if orthostatics are refractory to IVF's.  Tobacco use disorder Smokes a few daily but does not want nicotine patch while here.  Ronni Colace, DO 04/03/2024, 1:47 PM  Pager: [phone] After 5pm or weekend: [phone]

## 2024-04-04 ENCOUNTER — Other Ambulatory Visit (HOSPITAL_COMMUNITY): Payer: Self-pay

## 2024-04-04 DIAGNOSIS — R55 Syncope and collapse: Secondary | ICD-10-CM | POA: Diagnosis not present

## 2024-04-04 MED ORDER — GABAPENTIN 100 MG PO CAPS
100.0000 mg | ORAL_CAPSULE | Freq: Three times a day (TID) | ORAL | 0 refills | Status: DC
  Filled 2024-04-04: qty 90, 30d supply, fill #0

## 2024-04-04 MED ORDER — PANTOPRAZOLE SODIUM 40 MG PO TBEC
40.0000 mg | DELAYED_RELEASE_TABLET | Freq: Every day | ORAL | 0 refills | Status: DC
  Filled 2024-04-04: qty 30, 30d supply, fill #0

## 2024-04-04 MED ORDER — OXYCODONE HCL 5 MG PO TABS
5.0000 mg | ORAL_TABLET | ORAL | 0 refills | Status: DC | PRN
  Filled 2024-04-04: qty 20, 3d supply, fill #0

## 2024-04-04 MED ORDER — AMLODIPINE BESYLATE 5 MG PO TABS
5.0000 mg | ORAL_TABLET | Freq: Every day | ORAL | 0 refills | Status: DC
  Filled 2024-04-04: qty 30, 30d supply, fill #0

## 2024-04-04 MED ORDER — AMLODIPINE BESYLATE 5 MG PO TABS
5.0000 mg | ORAL_TABLET | Freq: Every day | ORAL | Status: DC
  Administered 2024-04-04: 5 mg via ORAL
  Filled 2024-04-04: qty 1

## 2024-04-04 NOTE — Discharge Summary (Cosign Needed Addendum)
 Name: Kyle Zamora MRN: [id number] DOB: 11-Jul-1961 63 y.o. PCP: Nooruddin, Saad, MD  Date of Admission: 04/01/2024  4:44 PM Date of Discharge: 04/04/2024 04/04/2024 Attending Physician: Dr. 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 Commonly known as: HYDRODIURIL  Take 1 tablet (25 mg total) by mouth daily.       TAKE these medications    amLODipine  5 MG tablet Commonly known as: NORVASC  Take 1 tablet (5 mg total) by mouth daily.   FISH OIL OMEGA-3 PO Take 1 capsule by mouth in the morning and at bedtime. Dosage unknown   gabapentin 100 MG capsule Commonly known as: NEURONTIN Take 1 capsule (100 mg total) by mouth 3 (three) times daily.   oxyCODONE  5 MG immediate release tablet Commonly known as: Oxy IR/ROXICODONE  Take 1 tablet (5 mg total) by mouth every 3 (three) hours as needed for severe pain (pain score 7-10).   pantoprazole  40 MG tablet Commonly known as: PROTONIX  Take 1 tablet (40 mg total) by mouth daily.   saccharomyces boulardii 250 MG capsule Commonly known as: FLORASTOR Take 250 mg by mouth 2 (two) times daily.   tamsulosin  0.4 MG Caps capsule Commonly known as: FLOMAX  Take 1 capsule (0.4 mg total) by mouth daily.        DISPOSITION AND FOLLOW-UP:  [name] was discharged from [facility] in Stable condition. At the hospital follow up visit  please address:  Orthostatic Syncope Fall Ensure patient has not had further episodes/symptoms. Repeat Orthostatics. Consider d/c Flomax  if symptomatic.   Severe neural foraminal stenosis Concerns for myelomalacia Follow-up outpatient PT referral. Patient is also to follow-up with [facility] so ensure patient has or will be making appointment.   Hypertension Patient should be bringing in BP log. Consider increasing Amlodipine  to 10 mg if needed. Consider d/c hydrochlorothiazide .   BPH Consider Urology referral as patient and his mother were inquiring about this.  Follow-up Appointments:  Follow-up Information     Nooruddin, Saad, MD Follow up.   Specialty: Internal Medicine Contact information: [address] [place], [address] [place] [address] [phone]         [facility] Follow up.   Specialty: Rehabilitation Why: outpatient PT referral made,  please call and arrange appointment time Contact information: [address] [place]  [address] [phone]                HOSPITAL COURSE:  Patient Summary:  Kyle Zamora is a 63 year old male with HTN, cocaine use disorder, and multilevel cervical neural foraminal stenosis who presented after a syncopal episode and admitted for syncopal work-up.    Orthostatic Syncope Fall Patient presented after syncopal episode while walking to the store. No prior history with this. He did not have prodromal symptoms prior to fall. Following the fall, he had worsening of previous  UE paresthesias and subjective weakness. MR cervical spine showed multilevel stenosis (see below). Labs on admission unremarkable aside from UDS positive for cocaine with UA consistent with dehydration. He stated he had been up all night two evenings prior drinking alcohol and using cocaine. He endorsed poor po intake with continued use of antihypertensives including  hydrochlorothiazide .  Orthostatics positive for the first two days of admission so he was given IVF's and antihypertensives were held with resolution in orthostatics. TTE unremarkable. Telemetry was unremarkable. EKG showed diffuse STE's but these were largely unchanged from prior and consistent with early repolarization variant that can be associated with cocaine use (Cardiology agreed). On day of discharge orthostatics were improved but still borderline. He remained completely asymptomatic. He was provided information regarding orthostatic hypotension triggers. Hydrochlorothiazide  was held until follow-up with [facility] on 6/4.  He was instructed to create BP log and bring to appointment. He was also counseled on the importance of lifestyle changes including cocaine cessation, limiting alcohol intake, and staying well-hydrated.   Severe neural foraminal stenosis Concerns for myelomalacia MRI with signs concerning for chronic compression from cervical stenosis. On exam he had weakness in shoulder abduction and adduction with significant pain in shoulder muscles. He further endorsed bilateral paresthesias that were new on the left and worsened on the right. He was initially placed in C-collar on admission and neurosurgery was consulted without need for inpatient management or continued use of C-collar. Pain improved with Oxycodone  and Robaxin and he was started on Gabapentin which will continue upon discharge. He will follow-up with Neurosurgery outpatient. He was also given ambulatory PT referral as HH PT was not covered with insurance.He was also given rolling walker and tub bench for home use.   Hypertension Remained largely normotensive during admission. Home Amlodipine  was resumed upon discharge and hydrochlorothiazide  was held given above. DISCHARGE INSTRUCTIONS:   Discharge Instructions     Ambulatory referral to Physical Therapy   Complete by: As directed    Call MD for:  difficulty breathing,  headache or visual disturbances   Complete by: As directed    Call MD for:  extreme fatigue   Complete by: As directed    Call MD for:  hives   Complete by: As directed    Call MD for:  persistant dizziness or light-headedness   Complete by: As directed    Call MD for:  persistant nausea and vomiting   Complete by: As directed    Call MD for:  redness, tenderness, or signs of infection (pain, swelling, redness, odor or green/yellow discharge around incision site)   Complete by: As directed    Call MD for:  severe uncontrolled pain   Complete by: As directed    Call MD for:  temperature >100.4   Complete by: As directed    Diet - low sodium heart healthy   Complete by: As directed    Discharge instructions   Complete by: As directed    You were admitted to the hospital after passing out.  We believe this was caused by your blood pressure getting too low from dehydration caused by alcohol intake, not drinking enough water, and continued use of your blood pressure medication hydrochlorothiazide .  Also potentially contributing was your cocaine use.  As we have discussed, please refrain from using cocaine in the future and continue to focus on hydration.  Also limit your alcohol intake. Sometimes people have low blood pressures simply from changing positions too quickly. It will be helpful to move  slowly when transitioning from lying to standing. I have attached information regarding "Orthostatic Hypotension".  We have also temporarily discontinued your hydrochlorothiazide  until you follow-up at the internal medicine center on 6/4 at 3:45 PM with Dr. Esaw Heckler.  In the meantime, please check your blood pressure a few times each day and write these numbers down and bring them to your appointment on 6/4. Please also call and make an appointment with [facility] at [phone].  You should also be hearing about a referral that was made for physical therapy.  We wish you all  the best!   Increase activity slowly   Complete by: As directed    No wound care   Complete by: As directed        SUBJECTIVE:  Arm pain bothersome but much improved on pain meds. Denies presyncope, comfortable with discharge. Discharge Vitals:   BP 137/81 (BP Location: Right Arm)   Pulse 77   Temp 98.5 F (36.9 C) (Oral)   Resp 16   Ht 5\' 11"  (1.803 m)   Wt 72.6 kg   SpO2 98%   BMI 22.32 kg/m   OBJECTIVE:   Constitutional: lying in bed with cervical collar, in no acute distress Cardiovascular: regular rate and rhythm, no m/r/g, no LEE Pulmonary/Chest: normal work of breathing on room air, lungs clear to auscultation bilaterallyon Neurological: alert & oriented Skin: warm and dry Psych: normal mood and behavior  Pertinent Labs, Studies, and Procedures:     Latest Ref Rng & Units 04/02/2024    5:19 AM 04/01/2024    5:03 PM 12/20/2023   12:04 PM  CBC  WBC 4.0 - 10.5 K/uL 7.0  9.1  6.5   Hemoglobin 13.0 - 17.0 g/dL 86.5  78.4  69.6   Hematocrit 39.0 - 52.0 % 41.6  42.5  35.8   Platelets 150 - 400 K/uL 177  216  246        Latest Ref Rng & Units 04/03/2024    5:08 AM 04/02/2024    5:19 AM 04/01/2024    5:03 PM  CMP  Glucose 70 - 99 mg/dL 295  284  132   BUN 8 - 23 mg/dL 5  6  9    Creatinine 0.61 - 1.24 mg/dL 4.40  1.02  7.25   Sodium 135 - 145 mmol/L 136  136  139   Potassium 3.5 - 5.1 mmol/L 4.0  3.4  4.0   Chloride 98 - 111 mmol/L 104  102  101   CO2 22 - 32 mmol/L 28  27  26    Calcium  8.9 - 10.3 mg/dL 9.1  9.0  36.6   Total Protein 6.5 - 8.1 g/dL   7.7   Total Bilirubin 0.0 - 1.2 mg/dL   0.7   Alkaline Phos 38 - 126 U/L   78   AST 15 - 41 U/L   19   ALT 0 - 44 U/L   15     ECHOCARDIOGRAM COMPLETE Result Date: 04/02/2024    ECHOCARDIOGRAM REPORT   Patient Name:   LADARRION TELFAIR Date of Exam: 04/02/2024 Medical Rec #:  [id number]      Height:       71.0 in Accession #:    [id number]     Weight:       160.1 lb Date of Birth:  Mar 31, 1961      BSA:          1.918  m Patient Age:  62 years       BP:           98/63 mmHg Patient Gender: M              HR:           75 bpm. Exam Location:  Inpatient Procedure: 2D Echo, Cardiac Doppler and Color Doppler (Both Spectral and Color            Flow Doppler were utilized during procedure). Indications:    Syncope  History:        Patient has no prior history of Echocardiogram examinations.                 Risk Factors:Current Smoker and Hypertension.  Sonographer:    Willey Harrier Referring Phys: [id number] JULIE ANNE WILLIAMS IMPRESSIONS  1. Left ventricular ejection fraction, by estimation, is 60 to 65%. The left ventricle has normal function. The left ventricle has no regional wall motion abnormalities. Left ventricular diastolic parameters were normal.  2. Right ventricular systolic function is normal. The right ventricular size is normal. Tricuspid regurgitation signal is inadequate for assessing PA pressure.  3. The mitral valve is grossly normal. Trivial mitral valve regurgitation. No evidence of mitral stenosis.  4. The aortic valve is tricuspid. Aortic valve regurgitation is not visualized. No aortic stenosis is present.  5. The inferior vena cava is normal in size with greater than 50% respiratory variability, suggesting right atrial pressure of 3 mmHg. FINDINGS  Left Ventricle: Left ventricular ejection fraction, by estimation, is 60 to 65%. The left ventricle has normal function. The left ventricle has no regional wall motion abnormalities. The left ventricular internal cavity size was normal in size. There is  no left ventricular hypertrophy. Left ventricular diastolic parameters were normal. Right Ventricle: The right ventricular size is normal. No increase in right ventricular wall thickness. Right ventricular systolic function is normal. Tricuspid regurgitation signal is inadequate for assessing PA pressure. Left Atrium: Left atrial size was normal in size. Right Atrium: Right atrial size was normal in size. Pericardium:  There is no evidence of pericardial effusion. Mitral Valve: The mitral valve is grossly normal. Trivial mitral valve regurgitation. No evidence of mitral valve stenosis. MV peak gradient, 3.2 mmHg. The mean mitral valve gradient is 1.0 mmHg. Tricuspid Valve: The tricuspid valve is grossly normal. Tricuspid valve regurgitation is trivial. No evidence of tricuspid stenosis. Aortic Valve: The aortic valve is tricuspid. Aortic valve regurgitation is not visualized. No aortic stenosis is present. Aortic valve peak gradient measures 6.6 mmHg. Pulmonic Valve: The pulmonic valve was grossly normal. Pulmonic valve regurgitation is not visualized. No evidence of pulmonic stenosis. Aorta: The aortic root and ascending aorta are structurally normal, with no evidence of dilitation. Venous: The right lower pulmonary vein is normal. The inferior vena cava is normal in size with greater than 50% respiratory variability, suggesting right atrial pressure of 3 mmHg. IAS/Shunts: The atrial septum is grossly normal.  LEFT VENTRICLE PLAX 2D LVIDd:         4.00 cm   Diastology LVIDs:         2.60 cm   LV e' medial:    7.94 cm/s LV PW:         0.80 cm   LV E/e' medial:  9.2 LV IVS:        0.80 cm   LV e' lateral:   11.90 cm/s LVOT diam:     2.20 cm   LV E/e' lateral: 6.2  LV SV:         80 LV SV Index:   42 LVOT Area:     3.80 cm  RIGHT VENTRICLE          IVC RV Basal diam:  2.80 cm  IVC diam: 1.70 cm TAPSE (M-mode): 2.3 cm LEFT ATRIUM           Index        RIGHT ATRIUM           Index LA Vol (A2C): 45.4 ml 23.67 ml/m  RA Area:     13.30 cm                                    RA Volume:   30.30 ml  15.80 ml/m  AORTIC VALVE AV Area (Vmax): 3.27 cm AV Vmax:        128.00 cm/s AV Peak Grad:   6.6 mmHg LVOT Vmax:      110.00 cm/s LVOT Vmean:     75.300 cm/s LVOT VTI:       0.211 m  AORTA Ao Root diam: 3.70 cm Ao Asc diam:  3.30 cm MITRAL VALVE MV Area (PHT): 2.46 cm    SHUNTS MV Area VTI:   3.40 cm    Systemic VTI:  0.21 m MV Peak grad:   3.2 mmHg    Systemic Diam: 2.20 cm MV Mean grad:  1.0 mmHg MV Vmax:       0.89 m/s MV Vmean:      57.0 cm/s MV Decel Time: 309 msec MV E velocity: 73.20 cm/s MV A velocity: 83.30 cm/s MV E/A ratio:  0.88 Jackquelyn Mass MD Electronically signed by Jackquelyn Mass MD Signature Date/Time: 04/02/2024/12:52:52 PM    Final    CT C-SPINE NO CHARGE Result Date: 04/01/2024 CLINICAL DATA:  Fall EXAM: CT CERVICAL SPINE WITHOUT CONTRAST TECHNIQUE: Multidetector CT imaging of the cervical spine was performed without intravenous contrast. Multiplanar CT image reconstructions were also generated. RADIATION DOSE REDUCTION: This exam was performed according to the departmental dose-optimization program which includes automated exposure control, adjustment of the mA and/or kV according to patient size and/or use of iterative reconstruction technique. COMPARISON:  None Available. FINDINGS: Alignment: No static subluxation. Facets are aligned. Occipital condyles and the lateral masses of C1 and C2 are normally approximated. Skull base and vertebrae: No acute fracture. Soft tissues and spinal canal: No prevertebral fluid or swelling. No visible canal hematoma. Disc levels: No advanced spinal canal or neural foraminal stenosis. Upper chest: No pneumothorax, pulmonary nodule or pleural effusion. Other: Normal visualized paraspinal cervical soft tissues. IMPRESSION: No acute fracture or static subluxation of the cervical spine. Electronically Signed   By: Juanetta Nordmann M.D.   On: 04/01/2024 20:40   CT ANGIO HEAD NECK W WO CM Result Date: 04/01/2024 CLINICAL DATA:  Fall EXAM: CT ANGIOGRAPHY HEAD AND NECK WITH AND WITHOUT CONTRAST TECHNIQUE: Multidetector CT imaging of the head and neck was performed using the standard protocol during bolus administration of intravenous contrast. Multiplanar CT image reconstructions and MIPs were obtained to evaluate the vascular anatomy. Carotid stenosis measurements (when applicable) are obtained  utilizing NASCET criteria, using the distal internal carotid diameter as the denominator. RADIATION DOSE REDUCTION: This exam was performed according to the departmental dose-optimization program which includes automated exposure control, adjustment of the mA and/or kV according to patient size and/or use of iterative reconstruction technique. CONTRAST:  75mL OMNIPAQUE  IOHEXOL  350 MG/ML SOLN COMPARISON:  None Available. FINDINGS: CT HEAD FINDINGS Brain: No mass,hemorrhage or extra-axial collection. Normal appearance of the parenchyma and CSF spaces. Vascular: No hyperdense vessel or unexpected vascular calcification. Skull: The visualized skull base, calvarium and extracranial soft tissues are normal. Sinuses/Orbits: No fluid levels or advanced mucosal thickening of the visualized paranasal sinuses. No mastoid or middle ear effusion. Normal orbits. CTA NECK FINDINGS Skeleton: No acute abnormality or high grade bony spinal canal stenosis. Other neck: Normal pharynx, larynx and major salivary glands. No cervical lymphadenopathy. Unremarkable thyroid gland. Upper chest: No pneumothorax or pleural effusion. No nodules or masses. Aortic arch: There is no calcific atherosclerosis of the aortic arch. Conventional 3 vessel aortic branching pattern. RIGHT carotid system: Atherosclerotic web at the carotid bifurcation without stenosis. LEFT carotid system: Normal without aneurysm, dissection or stenosis. Vertebral arteries: Left dominant configuration. There is no dissection, occlusion or flow-limiting stenosis to the skull base (V1-V3 segments). CTA HEAD FINDINGS POSTERIOR CIRCULATION: Vertebral arteries are normal. No proximal occlusion of the anterior or inferior cerebellar arteries. Basilar artery is normal. Superior cerebellar arteries are normal. Posterior cerebral arteries are normal. ANTERIOR CIRCULATION: Intracranial internal carotid arteries are normal. Anterior cerebral arteries are normal. Middle cerebral arteries  are normal. Venous sinuses: As permitted by contrast timing, patent. Anatomic variants: None Review of the MIP images confirms the above findings. IMPRESSION: 1. No emergent large vessel occlusion or high-grade stenosis of the intracranial arteries. 2. 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 Electronically Signed   By: Juanetta Nordmann M.D.   On: 04/01/2024 20:39   MR Cervical Spine Wo Contrast Result Date: 04/01/2024 CLINICAL DATA:  Trauma EXAM: MRI CERVICAL SPINE WITHOUT CONTRAST TECHNIQUE: Multiplanar, multisequence MR imaging of the cervical spine was performed. No intravenous contrast was administered. COMPARISON:  None Available. FINDINGS: Alignment: Reversed cervical lordosis.  No static subluxation. Vertebrae: No acute fracture. Major cervical ligaments are intact. Large ventral osteophytes at C4 and C5. Cord: Multifocal hyperintense T2-weighted signal within the spinal cord, most notably at C3-4 and C6-7, likely myelomalacia. Posterior Fossa, vertebral arteries, paraspinal tissues: Negative. Disc levels: C1-2: Unremarkable. C2-3: Normal disc space and facet joints. There is no spinal canal stenosis. No neural foraminal stenosis. C3-4: Left asymmetric disc bulge with uncovertebral hypertrophy. Mild spinal canal stenosis. Severe left neural foraminal stenosis. C4-5: Right asymmetric disc bulge with uncovertebral hypertrophy. There is no spinal canal stenosis. Severe right neural foraminal stenosis. C5-6: Disc bulge with bilateral uncovertebral hypertrophy. Mild spinal canal stenosis. Severe bilateral neural foraminal stenosis. C6-7: Disc bulge and bilateral uncovertebral hypertrophy. Moderate spinal canal stenosis. Severe bilateral neural foraminal stenosis. C7-T1: Normal disc space and facet joints. There is no spinal canal stenosis. No neural foraminal stenosis. IMPRESSION: 1. No acute fracture or ligamentous injury of the cervical spine. 2. Moderate spinal canal stenosis at  C6-7 and mild spinal canal stenosis at C3-4 and C5-6. 3. Multifocal hyperintense T2-weighted signal within the spinal cord, most notably at C3-4 and C6-7, likely myelomalacia. 4. Severe left C4, right C5, bilateral C6 and bilateral C7 neural foraminal stenosis. Electronically Signed   By: Juanetta Nordmann M.D.   On: 04/01/2024 20:08     Signed: Ronni Colace, DO  Internal Medicine Resident, PGY-1 [facility]  Pager: [phone] 3:43 PM, 04/05/2024

## 2024-04-04 NOTE — Plan of Care (Signed)
  Problem: Education: Goal: Knowledge of General Education information will improve Description: Including pain rating scale, medication(s)/side effects and non-pharmacologic comfort measures Outcome: Progressing   Problem: Activity: Goal: Risk for activity intolerance will decrease Outcome: Progressing   Problem: Nutrition: Goal: Adequate nutrition will be maintained Outcome: Progressing   Problem: Coping: Goal: Level of anxiety will decrease Outcome: Progressing   Problem: Elimination: Goal: Will not experience complications related to bowel motility Outcome: Progressing Goal: Will not experience complications related to urinary retention Outcome: Progressing   Problem: Pain Managment: Goal: General experience of comfort will improve and/or be controlled Outcome: Progressing   Problem: Skin Integrity: Goal: Risk for impaired skin integrity will decrease Outcome: Progressing   Problem: Safety: Goal: Ability to remain free from injury will improve Outcome: Progressing

## 2024-04-04 NOTE — Progress Notes (Signed)
 Occupational Therapy Treatment Patient Details Name: Kyle Zamora MRN: [id number] DOB: 1961-07-24 Today's Date: 04/04/2024   History of present illness Pt is a 63 y.o. male presenting to [facility] ED following a fall with concerns of syncope, complicated by bilateral UE paresthesia and weakness. 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  RN reports pt can remove collar and now just for comfort, cervical collar ON upon entry and pt reports he feels more comfortable wearing it- educated to follow orders per MD.  He completes bed mobility with modified independence, transfers/functional mobility in room using RW with supervision, and ADLs at supervision level using compensatory techniques to protect cervical spine. Educated on no bending, lifting and twisting, pt voiced understanding.  Mom at side at end of session and RN notified as she has some medical questions.  Pt with no further questions or concerns, no increased pain or syncopal symptoms during session. Updated dc plan for outpatient OT services at dc, will follow acutely to optimize independence.       If plan is discharge home, recommend the following:  Assist for transportation;Assistance with cooking/housework   Equipment Recommendations  Tub/shower bench    Recommendations for Other Services      Precautions / Restrictions Precautions Precautions: Fall;Cervical Precaution Booklet Issued: No Recall of Precautions/Restrictions: Intact Precaution/Restrictions Comments: per nursing 5/29 pt to wear cervical collar for comfort, pts preferece to wear (on upon entry) Required Braces or Orthoses: Cervical Brace Cervical Brace: Hard collar Restrictions Weight Bearing Restrictions Per Provider Order: No       Mobility Bed Mobility Overal bed mobility: Modified Independent                  Transfers Overall transfer level: Needs  assistance Equipment used: Rolling walker (2 wheels) Transfers: Sit to/from Stand Sit to Stand: Supervision           General transfer comment: no assist required, supervision for safety     Balance Overall balance assessment: Needs assistance Sitting-balance support: No upper extremity supported, Feet supported Sitting balance-Leahy Scale: Good     Standing balance support: No upper extremity supported, During functional activity, Bilateral upper extremity supported Standing balance-Leahy Scale: Fair Standing balance comment: supervison, uses RW dynamically                           ADL either performed or assessed with clinical judgement   ADL Overall ADL's : Needs assistance/impaired     Grooming: Supervision/safety;Standing           Upper Body Dressing : Supervision/safety;Sitting   Lower Body Dressing: Supervision/safety;Sit to/from stand Lower Body Dressing Details (indicate cue type and reason): changed socks using figure 4 technique, supervision in standing Toilet Transfer: Supervision/safety;Ambulation;Rolling walker (2 wheels)           Functional mobility during ADLs: Supervision/safety;Rolling walker (2 wheels);Cueing for safety General ADL Comments: pt using RW for mobility, cueing for posture and BLT compensatory techniques to protect neck.    Extremity/Trunk Assessment              Vision       Restaurant manager, fast food Communication: No apparent difficulties   Cognition Arousal: Alert Behavior During Therapy: WFL for tasks assessed/performed Cognition: No apparent impairments  Following commands: Intact        Cueing   Cueing Techniques: Verbal cues  Exercises      Shoulder Instructions       General Comments no c/o dizziness or lightheadedness during session.  Pts mom arrives at end of session.    Pertinent Vitals/ Pain       Pain  Assessment Pain Assessment: Faces Faces Pain Scale: Hurts a little bit Pain Location: UEs, L Pain Descriptors / Indicators: Pins and needles, Grimacing Pain Intervention(s): Limited activity within patient's tolerance, Monitored during session, Repositioned  Home Living                                          Prior Functioning/Environment              Frequency  Min 2X/week        Progress Toward Goals  OT Goals(current goals can now be found in the care plan section)  Progress towards OT goals: Progressing toward goals  Acute Rehab OT Goals Patient Stated Goal: get better OT Goal Formulation: With patient Time For Goal Achievement: 04/16/24 Potential to Achieve Goals: Good  Plan      Co-evaluation                 AM-PAC OT "6 Clicks" Daily Activity     Outcome Measure   Help from another person eating meals?: None Help from another person taking care of personal grooming?: None Help from another person toileting, which includes using toliet, bedpan, or urinal?: A Little Help from another person bathing (including washing, rinsing, drying)?: A Little Help from another person to put on and taking off regular upper body clothing?: None Help from another person to put on and taking off regular lower body clothing?: A Little 6 Click Score: 21    End of Session Equipment Utilized During Treatment: Rolling walker (2 wheels)  OT Visit Diagnosis: Unsteadiness on feet (R26.81);Muscle weakness (generalized) (M62.81)   Activity Tolerance Patient tolerated treatment well   Patient Left in bed;with call bell/phone within reach;with family/visitor present   Nurse Communication Mobility status        Time: 1610-9604 OT Time Calculation (min): 23 min  Charges: OT General Charges $OT Visit: 1 Visit OT Treatments $Self Care/Home Management : 23-37 mins  Kyle Zamora, OT Acute Rehabilitation Services Office [phone] Secure Chat  Preferred    Kyle Zamora 04/04/2024, 12:07 PM

## 2024-04-04 NOTE — TOC Transition Note (Signed)
 Transition of Care [facility]) - Discharge Note   Patient Details  Name: Kyle Zamora MRN: [id number] Date of Birth: [birth year]  Transition of Care [facility]) CM/SW Contact:  Jonathan Neighbor, RN Phone Number: 04/04/2024, 11:53 AM   Clinical Narrative:     Pt is discharging home with outpatient therapy through [facility]. Information on the AVS. Pt will call to schedule the first appointment.  DME to room per [employer].  Pt has transportation home.  Final next level of care: OP Rehab Barriers to Discharge: No Barriers Identified   Patient Goals and CMS Choice     Choice offered to / list presented to : Patient      Discharge Placement                       Discharge Plan and Services Additional resources added to the After Visit Summary for                                       Social Drivers of Health (SDOH) Interventions SDOH Screenings   Food Insecurity: No Food Insecurity (04/02/2024)  Housing: Low Risk  (04/02/2024)  Transportation Needs: No Transportation Needs (04/02/2024)  Utilities: Not At Risk (04/02/2024)  Depression (PHQ2-9): Low Risk  (12/20/2023)  Financial Resource Strain: Medium Risk (10/16/2023)  Physical Activity: Inactive (10/16/2023)  Social Connections: Moderately Isolated (10/16/2023)  Stress: No Stress Concern Present (10/16/2023)  Tobacco Use: High Risk (04/01/2024)  Health Literacy: Adequate Health Literacy (10/16/2023)     Readmission Risk Interventions     No data to display

## 2024-04-04 NOTE — Progress Notes (Signed)
 Reviewed AVS, patient expressed understanding of medications, MD follow up reviewed.   Removed IV, Site clean, dry and intact.  Patient states all belongings brought to the hospital at time of admission are accounted for and packed to take home.  Picked up medications from [facility] pharmacy. Pt transported to entrance A where family member was waiting in vehicle to transport home.

## 2024-04-05 ENCOUNTER — Encounter (HOSPITAL_COMMUNITY): Payer: Self-pay | Admitting: Internal Medicine

## 2024-04-05 ENCOUNTER — Telehealth: Payer: Self-pay

## 2024-04-05 DIAGNOSIS — F1410 Cocaine abuse, uncomplicated: Secondary | ICD-10-CM | POA: Diagnosis present

## 2024-04-05 DIAGNOSIS — M542 Cervicalgia: Secondary | ICD-10-CM | POA: Diagnosis present

## 2024-04-05 DIAGNOSIS — F10129 Alcohol abuse with intoxication, unspecified: Secondary | ICD-10-CM | POA: Diagnosis present

## 2024-04-05 DIAGNOSIS — E861 Hypovolemia: Secondary | ICD-10-CM | POA: Diagnosis present

## 2024-04-05 DIAGNOSIS — M5412 Radiculopathy, cervical region: Secondary | ICD-10-CM | POA: Diagnosis present

## 2024-04-05 NOTE — Progress Notes (Signed)
  Progress Note   Date: 04/04/2024  Patient Name: Kyle Zamora        MRN#: [id number]  Potassium was given for   Mild hypokalemia

## 2024-04-05 NOTE — Transitions of Care (Post Inpatient/ED Visit) (Signed)
   04/05/2024  Name: Kyle Zamora MRN: [id number] DOB: 1961/08/14  Today's TOC FU Call Status: Today's TOC FU Call Status:: Unsuccessful Call (1st Attempt) Unsuccessful Call (1st Attempt) Date: 04/05/24  Attempted to reach the patient regarding the most recent Inpatient/ED visit.  Follow Up Plan: Additional outreach attempts will be made to reach the patient to complete the Transitions of Care (Post Inpatient/ED visit) call.   Signature Darrall Ellison, LPN [facility] Nurse Health Advisor Direct Dial [phone]

## 2024-04-08 NOTE — Telephone Encounter (Signed)
 Please route this call to the correct Provider as the [facility] office did not call this patient per notes in Epic:  04/05/2024   Name: ANTOINO WESTHOFF           MRN: [id number]       DOB: 09/02/1961   Today's TOC FU Call Status: Today's TOC FU Call Status:: Unsuccessful Call (1st Attempt) Unsuccessful Call (1st Attempt) Date: 04/05/24   Attempted to reach the patient regarding the most recent Inpatient/ED visit.   Follow Up Plan: Additional outreach attempts will be made to reach the patient to complete the Transitions of Care (Post Inpatient/ED visit) call.    Signature Darrall Ellison, LPN [facility] Nurse Health Advisor Direct Dial [phone]   Copied from CRM [id number]. Topic: General - Other >> Apr 05, 2024  9:17 AM Retta Caster wrote: Reason for CRM: Patient returning call from LPN [facility]. Patient just discharged 05/29. Called office they instructed to call her direct. At [phone]. Called LVM for her to call back patient [phone]

## 2024-04-10 ENCOUNTER — Encounter: Admitting: Internal Medicine

## 2024-04-15 ENCOUNTER — Encounter: Payer: Self-pay | Admitting: Gastroenterology

## 2024-04-15 ENCOUNTER — Telehealth: Payer: Self-pay | Admitting: Gastroenterology

## 2024-04-15 ENCOUNTER — Ambulatory Visit (AMBULATORY_SURGERY_CENTER): Admitting: Gastroenterology

## 2024-04-15 VITALS — BP 99/72 | HR 85 | Temp 98.6°F | Resp 21 | Ht 71.0 in | Wt 160.0 lb

## 2024-04-15 DIAGNOSIS — K2960 Other gastritis without bleeding: Secondary | ICD-10-CM

## 2024-04-15 DIAGNOSIS — K315 Obstruction of duodenum: Secondary | ICD-10-CM | POA: Diagnosis not present

## 2024-04-15 DIAGNOSIS — K2950 Unspecified chronic gastritis without bleeding: Secondary | ICD-10-CM

## 2024-04-15 DIAGNOSIS — K269 Duodenal ulcer, unspecified as acute or chronic, without hemorrhage or perforation: Secondary | ICD-10-CM | POA: Diagnosis not present

## 2024-04-15 DIAGNOSIS — B9681 Helicobacter pylori [H. pylori] as the cause of diseases classified elsewhere: Secondary | ICD-10-CM

## 2024-04-15 MED ORDER — OMEPRAZOLE 40 MG PO CPDR: 40.0000 mg | DELAYED_RELEASE_CAPSULE | Freq: Every day | ORAL | 4 refills | Status: AC

## 2024-04-15 MED ORDER — PANTOPRAZOLE SODIUM 40 MG PO TBEC: 40.0000 mg | DELAYED_RELEASE_TABLET | Freq: Every day | ORAL | 4 refills | Status: DC

## 2024-04-15 MED ORDER — SODIUM CHLORIDE 0.9 % IV SOLN: 500.0000 mL | Freq: Once | INTRAVENOUS | Status: DC

## 2024-04-15 NOTE — Progress Notes (Signed)
 Questioned patient extensively about his neck and use of collar. He denies that he is supposed to be wearing it said it just made him feel better to wear it. Pt states no pain with movement or parasthesia in arms. Pt stated that the neurosurgeon said he was fine to have this procedure. Pt brought to room, collar remains in place for procedure. Pt positioned self and said there is no pain. tb

## 2024-04-15 NOTE — Telephone Encounter (Signed)
 Pt wants to know which medication does he take 30 mins before a meal?

## 2024-04-15 NOTE — Op Note (Addendum)
 [facility] Patient Name: Kyle Zamora Procedure Date: 04/15/2024 10:40 AM MRN: [id number] Endoscopist: [name] , MD, [id number] Age: 63 Referring MD:  Date of Birth: 01-29-1961 Gender: Male Account #: [id number] Procedure:                Upper GI endoscopy Indications:              H/O UGI bleed s/p EGD Jan 2025 with ulcers/HP                            gastritis-treated with quad therapy. Medicines:                Monitored Anesthesia Care Procedure:                Pre-Anesthesia Assessment:                           - Prior to the procedure, a History and Physical                            was performed, and patient medications and                            allergies were reviewed. The patient's tolerance of                            previous anesthesia was also reviewed. The risks                            and benefits of the procedure and the sedation                            options and risks were discussed with the patient.                            All questions were answered, and informed consent                            was obtained. Prior Anticoagulants: The patient has                            taken no anticoagulant or antiplatelet agents. ASA                            Grade Assessment: III - A patient with severe                            systemic disease. After reviewing the risks and                            benefits, the patient was deemed in satisfactory                            condition to undergo the procedure.  After obtaining informed consent, the endoscope was                            passed under direct vision. Throughout the                            procedure, the patient's blood pressure, pulse, and                            oxygen saturations were monitored continuously. The                            GIF [id number] #[id number] was introduced through the                            mouth, and advanced to  the second part of duodenum.                            The upper GI endoscopy was accomplished without                            difficulty. The patient tolerated the procedure                            well. Scope In: Scope Out: Findings:                 The examined esophagus was normal.                           The Z-line was regular and was found 40 cm from the                            incisors.                           Diffuse mild inflammation characterized by                            congestion (edema), erosions and erythema was found                            in the gastric body and in the gastric antrum.                            Biopsies were taken with a cold forceps for                            histology.                           An acquired benign-appearing, intrinsic mild                            stenosis was found in the D1/D2 jn with healed  ulcers. Easily traversed. Biopsies were taken with                            a cold forceps for histology.                           The exam was otherwise without abnormality. Complications:            No immediate complications. Estimated Blood Loss:     Estimated blood loss: none. Impression:               - Mild erosive gastritis. Biopsied. R/O HP                           - Acquired duodenal stenosis. Biopsied. Healed                            ul,cers                           - The examination was otherwise normal. Recommendation:           - Patient has a contact number available for                            emergencies. The signs and symptoms of potential                            delayed complications were discussed with the                            patient. Return to normal activities tomorrow.                            Written discharge instructions were provided to the                            patient.                           - Resume previous diet.                            - Must take Protonix  40 mg p.o. daily #90, 4RF                           - Minimize nonsteroidals.                           - The findings and recommendations were discussed                            with the patient's family.                           Addendum-Per patient he cannot afford Protonix . We  switched over to omeprazole 40 mg p.o. daily. If                            still with problems, he can use OTC 20 mg p.o. daily [name], MD 04/15/2024 10:59:04 AM This report has been signed electronically.

## 2024-04-15 NOTE — Progress Notes (Signed)
 Pt has neck brace on due to fall on 03/31/2024.   CRNA to assess if okay to proceed.   Dr.Gupta aware.  Had 8oz. water with meds at 0800.

## 2024-04-15 NOTE — Telephone Encounter (Signed)
 Returned the patient's phone call. Informed him that he should take the Omeprazole 30 min prior to each meal twice a day. Pt verbalized understanding. No further questions.

## 2024-04-15 NOTE — Progress Notes (Signed)
 Chief Complaint: Hospital follow-up Primary GI MD: Dr. Venice Gillis   HPI: 63 year old male history of hypertension and BPH presents for hospital follow-up.   Admitted 12/01/2023 with lower GI bleed, syncope, hypotension. Hgb 11.1 EtOH 47/drug screen positive for cocaine BUN 7, creatinine 0.74   Patient underwent inpatient EGD/colonoscopy.  EGD with nonobstructing Schatzki ring.  H. pylori positive gastritis.  Acquired duodenal stenosis with ulceration.  Colonoscopy with diverticulosis s/p clip placement and sessile serrated polyps and sigmoid   Patient was given quadruple therapy with doxycycline , metronidazole , Pepto-Bismol, and pantoprazole  and recommended a 5-year follow-up for his colonoscopy.   Patient presents here today and states he has not taken any medication over the last 5 months due to expense.  States he went to pick his amlodipine  up and it was $4 which was too expensive for him.  States he is unsure if he completed his antibiotics though he does note he picked up "stomach medicine" from [facility] that was free and he completed the entire prescription.  Unclear if he completed his quadruple therapy given for H. pylori.   He denies GERD, nausea, vomiting.  States he has had no further rectal bleeding and is doing well overall.   PREVIOUS GI WORKUP    Colonoscopy 12/01/2023 - Diverticulosis in the sigmoid colon, in the descending colon and in the ascending colon. Clip ( MR conditional) was placed. Clip manufacturer: [employer].  - One 5 mm polyp in the transverse colon, removed with a cold snare. Resected and retrieved.  - Two 3 to 5 mm polyps in the sigmoid colon, removed with a cold snare. Resected and retrieved.  - Anal papilla( e) were hypertrophied.  - Non- bleeding internal hemorrhoids.  - The examined portion of the ileum was normal. - Repeat 5 years (2030)   EGD 12/02/2023 - Non- obstructing Schatzki ring.  - Normal esophagus.  - Mild, non- ulcer gastritis.  Biopsied.  - Erythematous duodenopathy.  - Acquired duodenal stenosis with ulceration. Biopsied.  - Normal third portion of the duodenum.   FINAL MICROSCOPIC DIAGNOSIS:  A. DUODENUM, STRICTURE/ULCER, BIOPSY: -  Duodenal mucosa with prominent Brunner's glands, reactive/reparative change and focal evidence of erosion. -  Negative for dysplasia/malignancy on sections examined.  B. 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 -  A rare Helicobacter pylori organism is identified on the HE-stained slide, a confirmatory immunohistochemical stain is pending and will be reported in addendum.  C. COLON, TRANSVERSE, POLYPECTOMY: -  Intestinal mucosa architectural disarray and pyloric metaplasia.  Note: Pyloric gland metaplasia is typically seen after inflammatory injury in most frequently described in patients with inflammatory bowel disease (particularly Crohn's); however etiologies can be considered. Clinical correlation recommended.  D. COLON, SIGMOID, POLYPECTOMY: -  Sessile serrated polyp without dysplasi        Past Medical History:  Diagnosis Date   Benign prostate hyperplasia     Hypertension                 Past Surgical History:  Procedure Laterality Date   BIOPSY   12/02/2023    Procedure: BIOPSY;  Surgeon: Annis Kinder, DO;  Location: [facility];  Service: Gastroenterology;;   COLONOSCOPY WITH PROPOFOL  N/A 12/02/2023    Procedure: COLONOSCOPY WITH PROPOFOL ;  Surgeon: Annis Kinder, DO;  Location: [facility];  Service: Gastroenterology;  Laterality: N/A;   ESOPHAGOGASTRODUODENOSCOPY (EGD) WITH PROPOFOL  N/A 12/02/2023    Procedure: ESOPHAGOGASTRODUODENOSCOPY (EGD) WITH PROPOFOL ;  Surgeon: Annis Kinder, DO;  Location: [facility];  Service: Gastroenterology;  Laterality: N/A;   HEMOSTASIS CLIP PLACEMENT   12/02/2023    Procedure: HEMOSTASIS CLIP PLACEMENT;  Surgeon: Annis Kinder, DO;  Location: [facility];  Service: Gastroenterology;;   OPEN REDUCTION INTERNAL FIXATION (ORIF) DISTAL RADIAL FRACTURE Right 12/20/2019    Procedure: OPEN REDUCTION INTERNAL FIXATION (ORIF) RIGHT RADIUS SHAFT;  Surgeon: Rober Chimera, MD;  Location:  SURGERY CENTER;  Service: Orthopedics;  Laterality: Right;  PRE-OP BLOCK   OTHER SURGICAL HISTORY        "skull surgery as an infant"   POLYPECTOMY   12/02/2023    Procedure: POLYPECTOMY;  Surgeon: Annis Kinder, DO;  Location: [facility];  Service: Gastroenterology;;                Current Outpatient Medications  Medication Sig Dispense Refill   amLODipine  (NORVASC ) 5 MG tablet Take 1 tablet (5 mg total) by mouth daily. (Patient not taking: Reported on 02/21/2024) 30 tablet 3   atorvastatin  (LIPITOR) 20 MG tablet Take 1 tablet (20 mg total) by mouth daily. (Patient not taking: Reported on 02/21/2024) 90 tablet 3   diclofenac  Sodium (VOLTAREN  ARTHRITIS PAIN) 1 % GEL Apply 2 g topically 4 (four) times daily. (Patient not taking: Reported on 02/21/2024) 50 g 0   hydrochlorothiazide  (HYDRODIURIL ) 25 MG tablet Take 1 tablet (25 mg total) by mouth daily. (Patient not taking: Reported on 02/21/2024) 30 tablet 11   pantoprazole  (PROTONIX ) 40 MG tablet Take 1 tablet (40 mg total) by mouth 2 (two) times daily. (Patient not taking: Reported on 02/21/2024) 60 tablet 0   tamsulosin  (FLOMAX ) 0.4 MG CAPS capsule Take 1 capsule (0.4 mg total) by mouth daily. (Patient not taking: Reported on 02/21/2024) 30 capsule 3      No current facility-administered medications for this visit.             Allergies as of 02/21/2024 - Review Complete 02/21/2024  Allergen Reaction Noted   Penicillins Itching 02/12/2024           Family History  Problem Relation Age of Onset   Cancer Mother     Hypertension Mother     Heart failure Father     Hypertension Father     Esophageal cancer Brother     Cancer Maternal Aunt          pancreatic cancer    Cancer Maternal Aunt     Cancer Other            Social History         Socioeconomic History   Marital status: Single      Spouse name: Not on file   Number of children: Not on file   Years of education: Not on file   Highest education level: Not on file  Occupational History   Not on file  Tobacco Use   Smoking status: Every Day      Types: Cigarettes   Smokeless tobacco: Never   Tobacco comments:      Smokes when he drinks alcohol, 45 years   Vaping Use   Vaping status: Never Used  Substance and Sexual Activity   Alcohol use: Yes      Alcohol/week: 12.0 standard drinks of alcohol      Types: 12 Standard drinks or equivalent per week      Comment: 2-3 12 ox beers once a week and 12 pack on weekends   Drug use:  No   Sexual activity: Not Currently      Partners: Female  Other Topics Concern   Not on file  Social History Narrative    Lives in [place] with mother, was a Statistician, currently looking for a job    Social Drivers of Health        Financial Resource Strain: Medium Risk (10/16/2023)    Overall Financial Resource Strain (CARDIA)     Difficulty of Paying Living Expenses: Somewhat hard  Food Insecurity: No Food Insecurity (12/01/2023)    Hunger Vital Sign     Worried About Running Out of Food in the Last Year: Never true     Ran Out of Food in the Last Year: Never true  Transportation Needs: No Transportation Needs (12/01/2023)    PRAPARE - Therapist, art (Medical): No     Lack of Transportation (Non-Medical): No  Physical Activity: Inactive (10/16/2023)    Exercise Vital Sign     Days of Exercise per Week: 0 days     Minutes of Exercise per Session: 0 min  Stress: No Stress Concern Present (10/16/2023)    [employer] of Occupational Health - Occupational Stress Questionnaire     Feeling of Stress : Not at all  Social Connections: Moderately Isolated (10/16/2023)    Social Connection and Isolation Panel [NHANES]      Frequency of Communication with Friends and Family: Twice a week     Frequency of Social Gatherings with Friends and Family: Once a week     Attends Religious Services: Never     Database administrator or Organizations: No     Attends Banker Meetings: Never     Marital Status: Living with partner  Intimate Partner Violence: Not At Risk (12/01/2023)    Humiliation, Afraid, Rape, and Kick questionnaire     Fear of Current or Ex-Partner: No     Emotionally Abused: No     Physically Abused: No     Sexually Abused: No      Review of Systems:    Constitutional: No weight loss, fever, chills, weakness or fatigue HEENT: Eyes: No change in vision               Ears, Nose, Throat:  No change in hearing or congestion Skin: No rash or itching Cardiovascular: No chest pain, chest pressure or palpitations   Respiratory: No SOB or cough Gastrointestinal: See HPI and otherwise negative Genitourinary: No dysuria or change in urinary frequency Neurological: No headache, dizziness or syncope Musculoskeletal: No new muscle or joint pain Hematologic: No bleeding or bruising Psychiatric: No history of depression or anxiety      Physical Exam:  Vital signs: BP 120/70   Pulse 80   Ht 5\' 11"  (1.803 m)   Wt 160 lb (72.6 kg)   BMI 22.32 kg/m    Constitutional: NAD, alert and cooperative Head:  Normocephalic and atraumatic. Eyes:   PEERL, EOMI. No icterus. Conjunctiva pink. Respiratory: Respirations even and unlabored. Lungs clear to auscultation bilaterally.   No wheezes, crackles, or rhonchi.  Cardiovascular:  Regular rate and rhythm. No peripheral edema, cyanosis or pallor.  Gastrointestinal:  Soft, nondistended, nontender. No rebound or guarding. Normal bowel sounds. No appreciable masses or hepatomegaly. Rectal:  Declines Msk:  Symmetrical without gross deformities. Without edema, no deformity or joint abnormality.  Neurologic:  Alert and  oriented x4;  grossly normal  neurologically.  Skin:   Dry and  intact without significant lesions or rashes. Psychiatric: Oriented to person, place and time. Demonstrates good judgement and reason without abnormal affect or behaviors.     RELEVANT LABS AND IMAGING: CBC Labs (Brief)          Component Value Date/Time    WBC 6.5 12/20/2023 1204    WBC 5.2 12/11/2023 0918    RBC 3.72 (L) 12/20/2023 1204    RBC 3.60 (L) 12/11/2023 0918    HGB 11.1 (L) 12/20/2023 1204    HCT 35.8 (L) 12/20/2023 1204    PLT 246 12/20/2023 1204    MCV 96 12/20/2023 1204    MCH 29.8 12/20/2023 1204    MCH 31.1 12/03/2023 0641    MCHC 31.0 (L) 12/20/2023 1204    MCHC 33.2 12/11/2023 0918    RDW 13.6 12/20/2023 1204    LYMPHSABS 1.0 12/11/2023 0918    MONOABS 0.4 12/11/2023 0918    EOSABS 0.2 12/11/2023 0918    BASOSABS 0.1 12/11/2023 0918        CMP     Labs (Brief)          Component Value Date/Time    NA 143 12/20/2023 1204    K 4.8 12/20/2023 1204    CL 105 12/20/2023 1204    CO2 24 12/20/2023 1204    GLUCOSE 97 12/20/2023 1204    GLUCOSE 106 (H) 12/03/2023 0641    BUN 4 (L) 12/20/2023 1204    CREATININE 0.72 (L) 12/20/2023 1204    CALCIUM  9.6 12/20/2023 1204    PROT 5.6 (L) 12/02/2023 0723    PROT 6.1 03/29/2023 1122    ALBUMIN 3.1 (L) 12/02/2023 0723    ALBUMIN 3.8 (L) 03/29/2023 1122    AST 21 12/02/2023 0723    ALT 16 12/02/2023 0723    ALKPHOS 64 12/02/2023 0723    BILITOT 0.9 12/02/2023 0723    BILITOT 0.3 03/29/2023 1122    GFRNONAA >60 12/03/2023 0641    GFRAA >60 12/17/2019 1025          Assessment/Plan:    H. pylori gastritis Identified on EGD January 2025 treated with quadruple therapy.  Recommended repeat EGD in 8 weeks.  Unclear if patient completed his antibiotics (states cost was too high for his medicines although he reports picking up "stomach medicine"  from [facility]) though he is not currently having any symptoms. - Check H. pylori Diatherix - If H. pylori positive, will resend in  quadruple therapy and ensure patient picks up and completes antibiotic course - Repeat EGD in the [facility] - I thoroughly discussed the procedure with the patient (at bedside) to include nature of the procedure, alternatives, benefits, and risks (including but not limited to bleeding, infection, perforation, anesthesia/cardiac pulmonary complications).  Patient verbalized understanding and gave verbal consent to proceed with procedure.    Hematochezia Hospital admission January 2025 undergoing EGD/colonoscopy which was notable for H. pylori positive gastritis, duodenal stenosis, colon polyps, and diverticulosis s/p clip placement - Stable recent hemoglobin - No further bleeding - Please let us  know if hematochezia returns   History of colon polyps Sessile serrated polyps on colonoscopy January 2025 with repeat 2030 - Repeat 2030     This visit required 35 minutes of patient care (this includes precharting, chart review, review of results, face-to-face time used for counseling as well as treatment plan and follow-up. The patient was provided an opportunity to ask questions and all were answered. The patient agreed with the plan and demonstrated an understanding of  the instructions.    Gigi Kyle [facility] 02/21/2024, 9:26 AM   Cc: Nooruddin, Saad, MD     Attending physician's note   I have taken history, reviewed the chart and examined the patient. I performed a substantive portion of this encounter, including complete performance of at least one of the key components, in conjunction with the APP. I agree with the Advanced Practitioner's note, impression and recommendations.   For rpt EGD Prev HP gasritis.   Magnus Schuller, MD [facility] [phone]

## 2024-04-15 NOTE — Progress Notes (Signed)
 Patient's neck brace is secure. Neck has not been moved.  Mother ha several questions regarding the results. Spoke with mother in depth.

## 2024-04-15 NOTE — Patient Instructions (Addendum)
 Take your omeprazole once daily 1/2 hour before breakfast on an empty stomach.  It's essential that you take this medication. You may resume your regular medications as ordered today. Read the handouts given to you by your recovery room nurse. The biopsies could take up to 10 days to come back.   YOU HAD AN ENDOSCOPIC PROCEDURE TODAY AT THE [facility]:   Refer to the procedure report that was given to you for any specific questions about what was found during the examination.  If the procedure report does not answer your questions, please call your gastroenterologist to clarify.  YOU SHOULD EXPECT: Some feelings of bloating in the abdomen. Passage of more gas than usual.  Walking can help get rid of the air that was put into your GI tract during the procedure and reduce the bloating.   Please Note:  You might notice some irritation and congestion in your nose or some drainage.  This is from the oxygen used during your procedure.  There is no need for concern and it should clear up in a day or so.  SYMPTOMS TO REPORT IMMEDIATELY:   Following upper endoscopy (EGD)  Vomiting of blood or coffee ground material  New chest pain or pain under the shoulder blades  Painful or persistently difficult swallowing  New shortness of breath  Fever of 100F or higher  Black, tarry-looking stools  For urgent or emergent issues, a gastroenterologist can be reached at any hour by calling [phone]. Do not use MyChart messaging for urgent concerns.    DIET:  We do recommend a small meal at first, but then you may proceed to your regular diet.  Drink plenty of fluids but you should avoid alcoholic beverages for 24 hours.  ACTIVITY:  You should plan to take it easy for the rest of today and you should NOT DRIVE or use heavy machinery until tomorrow (because of the sedation medicines used during the test).    FOLLOW UP: Our staff will call the number listed on your records the next  business day following your procedure.  We will call around 7:15- 8:00 am to check on you and address any questions or concerns that you may have regarding the information given to you following your procedure. If we do not reach you, we will leave a message.     If any biopsies were taken you will be contacted by phone or by letter within the next 1-3 weeks.  Please call us  at [phone] if you have not heard about the biopsies in 3 weeks.    SIGNATURES/CONFIDENTIALITY: You and/or your care partner have signed paperwork which will be entered into your electronic medical record.  These signatures attest to the fact that that the information above on your After Visit Summary has been reviewed and is understood.  Full responsibility of the confidentiality of this discharge information lies with you and/or your care-partner.

## 2024-04-15 NOTE — Progress Notes (Signed)
 Neck was not manipulated during procedure. Collar remained on. Pt VSS, to Pacu, Report to RN.tb

## 2024-04-16 ENCOUNTER — Telehealth: Payer: Self-pay

## 2024-04-16 NOTE — Telephone Encounter (Signed)
  Follow up Call-     04/15/2024    9:27 AM  Call back number  Post procedure Call Back phone  # [phone]  Permission to leave phone message Yes     Patient questions:  Do you have a fever, pain , or abdominal swelling? No. Pain Score  0 *  Have you tolerated food without any problems? Yes.    Have you been able to return to your normal activities? Yes.    Do you have any questions about your discharge instructions: Diet   No. Medications  No. Follow up visit  No.  Do you have questions or concerns about your Care? No.  Actions: * If pain score is 4 or above: No action needed, pain <4.

## 2024-04-18 LAB — SURGICAL PATHOLOGY

## 2024-04-21 ENCOUNTER — Ambulatory Visit: Payer: Self-pay | Admitting: Gastroenterology

## 2024-04-22 ENCOUNTER — Ambulatory Visit: Payer: Self-pay | Admitting: Student

## 2024-04-22 ENCOUNTER — Other Ambulatory Visit (HOSPITAL_COMMUNITY)
Admission: AD | Admit: 2024-04-22 | Discharge: 2024-04-22 | Disposition: A | Discharge status: Home / Self Care | Attending: Internal Medicine | Admitting: Internal Medicine

## 2024-04-22 DIAGNOSIS — R3914 Feeling of incomplete bladder emptying: Secondary | ICD-10-CM

## 2024-04-22 DIAGNOSIS — M5412 Radiculopathy, cervical region: Secondary | ICD-10-CM | POA: Diagnosis not present

## 2024-04-22 DIAGNOSIS — N401 Enlarged prostate with lower urinary tract symptoms: Secondary | ICD-10-CM

## 2024-04-22 DIAGNOSIS — B9681 Helicobacter pylori [H. pylori] as the cause of diseases classified elsewhere: Secondary | ICD-10-CM

## 2024-04-22 DIAGNOSIS — G959 Disease of spinal cord, unspecified: Secondary | ICD-10-CM

## 2024-04-22 DIAGNOSIS — I10 Essential (primary) hypertension: Secondary | ICD-10-CM | POA: Diagnosis not present

## 2024-04-22 DIAGNOSIS — M542 Cervicalgia: Secondary | ICD-10-CM | POA: Diagnosis present

## 2024-04-22 LAB — PSA: Prostatic Specific Antigen: 2.79 ng/mL (ref 0.00–4.00)

## 2024-04-22 MED ORDER — GABAPENTIN 100 MG PO CAPS: ORAL_CAPSULE | ORAL | Status: DC

## 2024-04-22 MED ORDER — ACETAMINOPHEN 500 MG PO TABS: 1000.0000 mg | ORAL_TABLET | Freq: Three times a day (TID) | ORAL | 2 refills | Status: DC

## 2024-04-22 NOTE — Patient Instructions (Addendum)
 Expect calls from the urology office and the neurosurgeon's office.  I'll check your prostate test today and call you about the results.  For neck pain, take acetaminophen  1,000 mg three times daily.  Increase gabapentin  to 300 mg nightly. You can continue taking 100 mg twice daily during the daytime.  Return in about 3 months (around 07/23/2024) for cervical myelopathy, hypertension, alcohol use.   Remember to bring all of the medications that you take (including over the counter medications and supplements) with you to every clinic visit.  This after visit summary is an important review of tests, referrals, and medication changes that were discussed during your visit. If you have questions or concerns, call [phone]. Outside of clinic business hours, call the main hospital at [phone] and ask the operator for the on-call internal medicine resident.   Adria Hopkins MD 04/22/2024, 12:09 PM

## 2024-04-22 NOTE — Assessment & Plan Note (Signed)
 Normal bulk and tone on exam.  Pretty marked asymmetry of reflexes on exam.  Has first physical therapy appointment on Wednesday for this.  Neurosurgery follow-up was recommended for him while hospitalized.  I have placed this referral today.  I am also going to increase gabapentin , currently taking 100 mg 3 times daily.  Recommend 100 mg twice daily with 300 mg nightly.

## 2024-04-22 NOTE — Assessment & Plan Note (Signed)
 Blood pressure well-controlled on amlodipine  5 mg daily.  Hydrochlorothiazide  was paused on discharge out of concern for orthostatic hypotension.  I will discontinue this medicine permanently.

## 2024-04-22 NOTE — Progress Notes (Signed)
 Patient name: Kyle Zamora Date of birth: 1961/07/14 Date of visit: 04/22/24  Subjective   Chief concern: Follow-up (Patient here for hospital follow up with medication refill / request full exam / request copy of x ray report / mom asking doctor to check back of head/ requesting ? Lab test/)  HPI: Kyle Zamora is here for a hospital follow-up visit.  He was admitted from 04/02/2024 to 04/04/2024 for syncope due to orthostatic hypotension complicated by alcohol and cocaine use disorder.  He was found to have cervical spinal canal stenosis with myelomalacia and multilevel bilateral cervical neuroforaminal stenosis.  He is accompanied by his mother with whom he lives.  He is wearing a neck brace today.  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  We talked about alcohol use, he does not think that it is a problem at present.  He has had 80 ounces since discharge.  He paused taking hydrochlorothiazide  as instructed on hospital discharge.  He is keeping a faithful log of his blood pressure.  Review of Systems  Genitourinary:        Straining to urinate  Musculoskeletal:  Positive for neck pain. Negative for falls.    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) 04/05/2024   Syncope due to orthostatic hypotension 04/01/2024   Acute blood loss anemia 12/02/2023   Symptomatic anemia 12/02/2023   Diverticulosis of colon without hemorrhage 12/02/2023   Adenomatous polyp of transverse colon 12/02/2023   Adenomatous polyp of sigmoid colon 12/02/2023   Grade I internal hemorrhoids 12/02/2023   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   Duodenitis 12/02/2023   Acute GI bleeding 12/01/2023   Hx of abscess of skin and subcutaneous tissue  10/17/2023   HLD (hyperlipidemia) 10/17/2023   Arm pain 07/04/2023   Knee pain 04/10/2023   Hypertension 03/30/2023   BPH (benign prostatic hyperplasia) 03/30/2023   Alcohol use 03/30/2023   Colon cancer screening 03/30/2023   ED (erectile dysfunction) of non-organic origin 03/30/2023   Tobacco use 03/30/2023   Health care maintenance 03/30/2023   Past Medical History:  Diagnosis Date   Benign prostate hyperplasia    Hypertension    Syncope due to orthostatic hypotension 04/01/2024   Outpatient Encounter Medications as of 04/22/2024  Medication Sig   acetaminophen  (TYLENOL ) 500 MG tablet Take 2 tablets (1,000 mg total) by mouth every 8 (eight) hours.   amLODipine  (NORVASC ) 5 MG tablet Take 1 tablet (5 mg total) by mouth daily.   gabapentin  (NEURONTIN ) 100 MG capsule Take 1 capsule (100 mg total) by mouth 2 (two) times daily AND 3 capsules (300 mg total) at bedtime.   Omega-3 Fatty Acids (FISH OIL OMEGA-3 PO) Take 1 capsule by mouth in the morning and at bedtime. Dosage unknown   omeprazole  (PRILOSEC) 40 MG capsule Take 1 capsule (40 mg total) by mouth daily.   tamsulosin  (FLOMAX ) 0.4 MG CAPS capsule Take 1 capsule (0.4 mg total) by mouth daily.   [DISCONTINUED] gabapentin  (NEURONTIN ) 100 MG capsule Take 1 capsule (100 mg total) by mouth 3 (three) times daily.   [DISCONTINUED] hydrochlorothiazide  (HYDRODIURIL ) 25 MG tablet Take 1 tablet (25 mg total) by mouth daily. (Patient not taking: Reported on 04/15/2024)   [DISCONTINUED] oxyCODONE  (OXY IR/ROXICODONE ) 5 MG  immediate release tablet Take 1 tablet (5 mg total) by mouth every 3 (three) hours as needed for severe pain (pain score 7-10). (Patient not taking: Reported on 04/15/2024)   [DISCONTINUED] saccharomyces boulardii (FLORASTOR) 250 MG capsule Take 250 mg by mouth 2 (two) times daily. (Patient not taking: Reported on 04/15/2024)   [DISCONTINUED] 0.9 %  sodium chloride  infusion    No facility-administered encounter medications on file as  of 04/22/2024.   Past Surgical History:  Procedure Laterality Date   BIOPSY  12/02/2023   Procedure: BIOPSY;  Surgeon: Annis Kinder, DO;  Location: [facility];  Service: Gastroenterology;;   COLONOSCOPY WITH PROPOFOL  N/A 12/02/2023   Procedure: COLONOSCOPY WITH PROPOFOL ;  Surgeon: Annis Kinder, DO;  Location: [facility];  Service: Gastroenterology;  Laterality: N/A;   ESOPHAGOGASTRODUODENOSCOPY (EGD) WITH PROPOFOL  N/A 12/02/2023   Procedure: ESOPHAGOGASTRODUODENOSCOPY (EGD) WITH PROPOFOL ;  Surgeon: Annis Kinder, DO;  Location: [facility];  Service: Gastroenterology;  Laterality: N/A;   HEMOSTASIS CLIP PLACEMENT  12/02/2023   Procedure: HEMOSTASIS CLIP PLACEMENT;  Surgeon: Annis Kinder, DO;  Location: [facility];  Service: Gastroenterology;;   OPEN REDUCTION INTERNAL FIXATION (ORIF) DISTAL RADIAL FRACTURE Right 12/20/2019   Procedure: OPEN REDUCTION INTERNAL FIXATION (ORIF) RIGHT RADIUS SHAFT;  Surgeon: Rober Chimera, MD;  Location: [facility];  Service: Orthopedics;  Laterality: Right;  PRE-OP BLOCK   OTHER SURGICAL HISTORY     skull surgery as an infant   POLYPECTOMY  12/02/2023   Procedure: POLYPECTOMY;  Surgeon: Annis Kinder, DO;  Location: [facility];  Service: Gastroenterology;;   Family History  Problem Relation Age of Onset   Cancer Mother    Hypertension Mother    Heart failure Father    Hypertension Father    Esophageal cancer Brother    Cancer Maternal Aunt        pancreatic cancer   Cancer Maternal Aunt    Cancer Other    Social History   Socioeconomic History   Marital status: Single    Spouse name: Not on file   Number of children: Not on file   Years of education: Not on file   Highest education level: Not on file  Occupational History   Not on file  Tobacco Use   Smoking status: Some Days    Types: Cigarettes   Smokeless tobacco: Never   Tobacco comments:    Smokes when he drinks alcohol, 45 years    Vaping Use   Vaping status: Never Used  Substance and Sexual Activity   Alcohol use: Yes    Alcohol/week: 12.0 standard drinks of alcohol    Types: 12 Standard drinks or equivalent per week    Comment: 2-3 12 ox beers once a week and 12 pack on weekends   Drug use: No   Sexual activity: Not Currently    Partners: Female  Other Topics Concern   Not on file  Social History Narrative   Lives in [place] with mother, was a Statistician, currently looking for a job   Social Drivers of Corporate investment banker Strain: Medium Risk (10/16/2023)   Overall Financial Resource Strain (CARDIA)    Difficulty of Paying Living Expenses: Somewhat hard  Food Insecurity: No Food Insecurity (04/02/2024)   Hunger Vital Sign    Worried About Running Out of Food in the Last Year: Never true    Ran Out of Food in the Last Year: Never true  Transportation Needs: No Transportation Needs (04/02/2024)  PRAPARE - Administrator, Civil Service (Medical): No    Lack of Transportation (Non-Medical): No  Physical Activity: Inactive (10/16/2023)   Exercise Vital Sign    Days of Exercise per Week: 0 days    Minutes of Exercise per Session: 0 min  Stress: No Stress Concern Present (10/16/2023)   [employer] of Occupational Health - Occupational Stress Questionnaire    Feeling of Stress : Not at all  Social Connections: Moderately Isolated (10/16/2023)   Social Connection and Isolation Panel    Frequency of Communication with Friends and Family: Twice a week    Frequency of Social Gatherings with Friends and Family: Once a week    Attends Religious Services: Never    Database administrator or Organizations: No    Attends Banker Meetings: Never    Marital Status: Living with partner  Intimate Partner Violence: Not At Risk (04/02/2024)   Humiliation, Afraid, Rape, and Kick questionnaire    Fear of Current or Ex-Partner: No    Emotionally Abused: No    Physically Abused: No     Sexually Abused: No     Objective  There were no vitals filed for this visit.There is no height or weight on file to calculate BMI.   Physical Exam Constitutional:      General: He is not in acute distress.    Appearance: Normal appearance.  Neck:     Thyroid: No thyroid mass, thyromegaly or thyroid tenderness.     Vascular: No carotid bruit.     Comments: Brace in place. Cardiovascular:     Rate and Rhythm: Normal rate and regular rhythm.     Pulses: Normal pulses.  Pulmonary:     Effort: Pulmonary effort is normal.  Abdominal:     Palpations: There is no hepatomegaly or splenomegaly.   Musculoskeletal:     Right lower leg: No edema.     Left lower leg: No edema.  Lymphadenopathy:     Cervical: No cervical adenopathy.   Skin:    General: Skin is warm and dry.   Neurological:     Mental Status: He is alert. Mental status is at baseline.     Cranial Nerves: No facial asymmetry.     Motor: No tremor.     Deep Tendon Reflexes:     Reflex Scores:      Bicep reflexes are 0 on the right side and 3+ on the left side.      Brachioradialis reflexes are 0 on the right side and 3+ on the left side.  Psychiatric:        Mood and Affect: Mood normal.        Behavior: Behavior normal.      Assessment & Plan  Problem List Items Addressed This Visit     Hypertension   Blood pressure well-controlled on amlodipine  5 mg daily.  Hydrochlorothiazide  was paused on discharge out of concern for orthostatic hypotension.  I will discontinue this medicine permanently.      BPH (benign prostatic hyperplasia)   Treated with tamsulosin  for prostatic hyperplasia.  Pretty severe symptoms.  Sometimes sits for 5 minutes before starting his stream and has to strain.  Differential includes neurogenic bladder, prostate neoplasm.  PSA level today.  Referral to urology.      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  Pretty marked asymmetry of reflexes on exam.  Has first physical therapy appointment on Wednesday for this.  Neurosurgery follow-up was recommended for him while hospitalized.  I have placed this referral today.  I am also going to increase gabapentin , currently taking 100 mg 3 times daily.  Recommend 100 mg twice daily with 300 mg nightly.      Relevant Medications   gabapentin  (NEURONTIN ) 100 MG capsule   Other Relevant Orders   Ambulatory referral to Neurosurgery   Acute neck pain - Primary   Relevant Medications   acetaminophen  (TYLENOL ) 500 MG tablet   Return in about 3 months (around 07/23/2024) for cervical myelopathy, hypertension, alcohol use.  Adria Hopkins MD 04/22/2024, 2:53 PM

## 2024-04-22 NOTE — Assessment & Plan Note (Signed)
 Treated with tamsulosin  for prostatic hyperplasia.  Pretty severe symptoms.  Sometimes sits for 5 minutes before starting his stream and has to strain.  Differential includes neurogenic bladder, prostate neoplasm.  PSA level today.  Referral to urology.

## 2024-04-23 NOTE — Progress Notes (Signed)
 Internal Medicine Clinic Attending  Case discussed with the resident at the time of the visit.  We reviewed the resident's history and exam and pertinent patient test results.  I agree with the assessment, diagnosis, and plan of care documented in the resident's note.

## 2024-04-23 NOTE — Therapy (Signed)
 OUTPATIENT PHYSICAL THERAPY NEURO EVALUATION - ARRIVED NO CHARGE   Patient Name: Kyle Zamora MRN: [id number] DOB:[birth year], 63 y.o., male Today's Date: 04/24/2024   PCP: Nooruddin, Saad, MD  REFERRING PROVIDER:  Sherol Dixie, MD  END OF SESSION:  PT End of Session - 04/24/24 0854     Visit Number 1   arrived no charge   PT Start Time [id number]   pt late to eval   PT Stop Time 0905    PT Time Calculation (min) 13 min          Past Medical History:  Diagnosis Date   Benign prostate hyperplasia    Hypertension    Syncope due to orthostatic hypotension 04/01/2024   Past Surgical History:  Procedure Laterality Date   BIOPSY  12/02/2023   Procedure: BIOPSY;  Surgeon: Annis Kinder, DO;  Location: [facility];  Service: Gastroenterology;;   COLONOSCOPY WITH PROPOFOL  N/A 12/02/2023   Procedure: COLONOSCOPY WITH PROPOFOL ;  Surgeon: Annis Kinder, DO;  Location: [facility];  Service: Gastroenterology;  Laterality: N/A;   ESOPHAGOGASTRODUODENOSCOPY (EGD) WITH PROPOFOL  N/A 12/02/2023   Procedure: ESOPHAGOGASTRODUODENOSCOPY (EGD) WITH PROPOFOL ;  Surgeon: Annis Kinder, DO;  Location: [facility];  Service: Gastroenterology;  Laterality: N/A;   HEMOSTASIS CLIP PLACEMENT  12/02/2023   Procedure: HEMOSTASIS CLIP PLACEMENT;  Surgeon: Annis Kinder, DO;  Location: [facility];  Service: Gastroenterology;;   OPEN REDUCTION INTERNAL FIXATION (ORIF) DISTAL RADIAL FRACTURE Right 12/20/2019   Procedure: OPEN REDUCTION INTERNAL FIXATION (ORIF) RIGHT RADIUS SHAFT;  Surgeon: Rober Chimera, MD;  Location: [facility];  Service: Orthopedics;  Laterality: Right;  PRE-OP BLOCK   OTHER SURGICAL HISTORY     skull surgery as an infant   POLYPECTOMY  12/02/2023   Procedure: POLYPECTOMY;  Surgeon: Annis Kinder, DO;  Location: [facility];  Service: Gastroenterology;;   Patient Active Problem List   Diagnosis Date Noted   Hypovolemia dehydration  04/05/2024   Alcohol abuse with intoxication (HCC) 04/05/2024   Cocaine abuse (HCC) 04/05/2024   Acute neck pain 04/05/2024   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) 04/05/2024   Syncope due to orthostatic hypotension 04/01/2024   Acute blood loss anemia 12/02/2023   Symptomatic anemia 12/02/2023   Diverticulosis of colon without hemorrhage 12/02/2023   Adenomatous polyp of transverse colon 12/02/2023   Adenomatous polyp of sigmoid colon 12/02/2023   Grade I internal hemorrhoids 12/02/2023   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   Duodenitis 12/02/2023   Acute GI bleeding 12/01/2023   Hx of abscess of skin and subcutaneous tissue 10/17/2023   HLD (hyperlipidemia) 10/17/2023   Arm pain 07/04/2023   Knee pain 04/10/2023   Hypertension 03/30/2023   BPH (benign prostatic hyperplasia) 03/30/2023   Alcohol use 03/30/2023   Colon cancer screening 03/30/2023   ED (erectile dysfunction) of non-organic origin 03/30/2023   Tobacco use 03/30/2023   Health care maintenance 03/30/2023    ONSET DATE: 04/04/2024  REFERRING DIAG: R55 (ICD-10-CM) - Syncope and collapse   THERAPY DIAG:  Cervicalgia  Rationale for Evaluation and Treatment: Rehabilitation  SUBJECTIVE:  SUBJECTIVE STATEMENT: Has not yet seen neurosurgeon since being home from the hospital. Saw his PCP the other day who referred him to outpatient neuro surgery. Still having pain in his arms and his neck.  Pt accompanied by: Mom, Kyle Zamora  PERTINENT HISTORY: PMH: Orthostatic syncope, fall, Severe neural foraminal stenosis with concerns for myelomalacia, HTN, BPH  He was admitted from 04/02/2024 to 04/04/2024 for syncope due to orthostatic hypotension complicated by alcohol and cocaine use disorder. Following the fall, he had  worsening of previous UE paresthesias and subjective weakness. He was found to have cervical spinal canal stenosis with myelomalacia and multilevel bilateral cervical neuroforaminal stenosis.   MRI with signs concerning for chronic compression from cervical stenosis. On exam he had weakness in shoulder abduction and adduction with significant pain in shoulder muscles. He further endorsed bilateral paresthesias that were new on the left and worsened on the right. He was initially placed in C-collar on admission and neurosurgery was consulted without need for inpatient management or continued use of C-collar.   ARRIVED NO CHARGE: Discussed with pt and pt's mom that pt's referral from the hospital is for syncope and collapse and not a diagnosis that would be appropriate for treating pt's neck pain. Pt and pt's mom reports that they saw the neurosurgeon in the hospital and they recommended trying a conservative approach first before potentially surgery. Discussed will need appropriate PT referral from neurosurgeon for neck pain before pt can safely and appropriately participate in PT for this. Pt and pt's mom verbalized understanding. PT gave them neurosurgery office information with pt to call them (as upon hospital D/C, neurosurgery outpatient follow-up was recommended) and get appropriate referral or follow-up   Kyle Zamora, PT, DPT 04/24/2024, 9:26 AM

## 2024-04-24 ENCOUNTER — Ambulatory Visit: Admitting: Physical Therapy

## 2024-04-24 ENCOUNTER — Encounter: Payer: Self-pay | Admitting: Physical Therapy

## 2024-04-24 DIAGNOSIS — M542 Cervicalgia: Secondary | ICD-10-CM

## 2024-04-24 NOTE — Progress Notes (Signed)
 Internal Medicine Clinic Attending  Case discussed with the resident at the time of the visit.  We reviewed the resident's history and exam and pertinent patient test results.  I agree with the assessment, diagnosis, and plan of care documented in the resident's note.

## 2024-04-25 ENCOUNTER — Telehealth: Payer: Self-pay

## 2024-04-25 ENCOUNTER — Other Ambulatory Visit: Payer: Self-pay | Admitting: Student

## 2024-04-25 NOTE — Telephone Encounter (Signed)
 Copied from CRM [id number]. Topic: General - Other >> Apr 25, 2024  9:54 AM [name] wrote: Reason for CRM: Patient is calling in because he doesn't know how to use MyChart and isn't interested in using it. Patient is requesting a call, text, or letter if there is information he is needing to know.

## 2024-04-25 NOTE — Telephone Encounter (Signed)
 Medication sent to pharmacy

## 2024-04-25 NOTE — Telephone Encounter (Signed)
 I called the patient, unable to reach him. I lvm for him to give us  a call back.

## 2024-04-29 ENCOUNTER — Telehealth: Payer: Self-pay

## 2024-04-29 DIAGNOSIS — M5412 Radiculopathy, cervical region: Secondary | ICD-10-CM

## 2024-04-29 NOTE — Telephone Encounter (Signed)
 Copied from CRM [id number]. Topic: Clinical - Medical Advice >> Apr 26, 2024  9:53 AM Mace SQUIBB wrote: Reason for CRM: Patient went on the 18th to start therapy, but they told him he needs to see his surgeon first, He wants to know if it's okay to start doing his own exercises at home. Please call [phone]

## 2024-04-29 NOTE — Telephone Encounter (Signed)
 Fine to start some light range of motion exercises at home for this. He may not be able to see a neurosurgeon for a long time. In the hospital they recommended conservative therapy. I will put a new home health order in for PT/OT as I think a concerted therapy effort will help him in the meantime, anyway they recommended PT/OT in their consult note from his hospitalization.  Ozell Kung MD 04/29/2024, 5:36 PM

## 2024-04-29 NOTE — Addendum Note (Signed)
 Addended by: NORRINE SHARPER on: 04/29/2024 05:39 PM   Modules accepted: Orders

## 2024-05-24 ENCOUNTER — Ambulatory Visit: Admitting: Physical Therapy

## 2024-05-24 NOTE — Therapy (Incomplete)
 OUTPATIENT PHYSICAL THERAPY NEURO EVALUATION   Patient Name: Kyle Zamora MRN: [id number] DOB:1961-05-13, 63 y.o., male Today's Date: 05/24/2024   PCP: Nelia Dirks, MD REFERRING PROVIDER: Lanis Pupa, MD  END OF SESSION:   Past Medical History:  Diagnosis Date   Benign prostate hyperplasia    Hypertension    Syncope due to orthostatic hypotension 04/01/2024   Past Surgical History:  Procedure Laterality Date   BIOPSY  12/02/2023   Procedure: BIOPSY;  Surgeon: San Sandor GAILS, DO;  Location: [facility];  Service: Gastroenterology;;   COLONOSCOPY WITH PROPOFOL  N/A 12/02/2023   Procedure: COLONOSCOPY WITH PROPOFOL ;  Surgeon: San Sandor GAILS, DO;  Location: [facility];  Service: Gastroenterology;  Laterality: N/A;   ESOPHAGOGASTRODUODENOSCOPY (EGD) WITH PROPOFOL  N/A 12/02/2023   Procedure: ESOPHAGOGASTRODUODENOSCOPY (EGD) WITH PROPOFOL ;  Surgeon: San Sandor GAILS, DO;  Location: [facility];  Service: Gastroenterology;  Laterality: N/A;   HEMOSTASIS CLIP PLACEMENT  12/02/2023   Procedure: HEMOSTASIS CLIP PLACEMENT;  Surgeon: San Sandor GAILS, DO;  Location: [facility];  Service: Gastroenterology;;   OPEN REDUCTION INTERNAL FIXATION (ORIF) DISTAL RADIAL FRACTURE Right 12/20/2019   Procedure: OPEN REDUCTION INTERNAL FIXATION (ORIF) RIGHT RADIUS SHAFT;  Surgeon: Sebastian Lenis, MD;  Location: [facility];  Service: Orthopedics;  Laterality: Right;  PRE-OP BLOCK   OTHER SURGICAL HISTORY     skull surgery as an infant   POLYPECTOMY  12/02/2023   Procedure: POLYPECTOMY;  Surgeon: San Sandor GAILS, DO;  Location: [facility];  Service: Gastroenterology;;   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) 04/05/2024    Syncope due to orthostatic hypotension 04/01/2024   Acute blood loss anemia 12/02/2023   Symptomatic anemia 12/02/2023   Diverticulosis of colon without hemorrhage 12/02/2023   Adenomatous polyp of transverse colon 12/02/2023   Adenomatous polyp of sigmoid colon 12/02/2023   Grade I internal hemorrhoids 12/02/2023   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   Duodenitis 12/02/2023   Acute GI bleeding 12/01/2023   Hx of abscess of skin and subcutaneous tissue 10/17/2023   HLD (hyperlipidemia) 10/17/2023   Arm pain 07/04/2023   Knee pain 04/10/2023   Hypertension 03/30/2023   BPH (benign prostatic hyperplasia) 03/30/2023   Alcohol use 03/30/2023   Colon cancer screening 03/30/2023   ED (erectile dysfunction) of non-organic origin 03/30/2023   Tobacco use 03/30/2023   Health care maintenance 03/30/2023    ONSET DATE: 04/25/2024 (referral)   REFERRING DIAG: M47.812 (ICD-10-CM) - Spondylosis without myelopathy or radiculopathy, cervical region  THERAPY DIAG:  No diagnosis found.  Rationale for Evaluation and Treatment: Rehabilitation  SUBJECTIVE:  SUBJECTIVE STATEMENT: *** Pt accompanied by: {accompnied:27141}  PERTINENT HISTORY: Orthostatic syncope, fall, Severe neural foraminal stenosis with concerns for myelomalacia, HTN, BPH  He was admitted from 04/02/2024 to 04/04/2024 for syncope due to orthostatic hypotension complicated by alcohol and cocaine use disorder. Following the fall, he had worsening of previous UE paresthesias and subjective weakness. He was found to have cervical spinal canal stenosis with myelomalacia and multilevel bilateral cervical neuroforaminal stenosis.    MRI with signs concerning for chronic compression from cervical stenosis. On exam he had weakness in shoulder abduction and  adduction with significant pain in shoulder muscles. He further endorsed bilateral paresthesias that were new on the left and worsened on the right. He was initially placed in C-collar on admission and neurosurgery was consulted without need for inpatient management or continued use of C-collar.   PAIN:  Are you having pain? {OPRCPAIN:27236}  PRECAUTIONS: {Therapy precautions:24002}  RED FLAGS: {PT Red Flags:29287}   WEIGHT BEARING RESTRICTIONS: {Yes ***/No:24003}  FALLS: Has patient fallen in last 6 months? {fallsyesno:27318}  LIVING ENVIRONMENT: Lives with: {OPRC lives with:25569::lives with their family} Lives in: {Lives in:25570} Stairs: {opstairs:27293} Has following equipment at home: {Assistive devices:23999}  PLOF: {PLOF:24004}  PATIENT GOALS: ***  OBJECTIVE:  Note: Objective measures were completed at Evaluation unless otherwise noted.  DIAGNOSTIC FINDINGS:   MRI of C-spine from 04/01/24 IMPRESSION: 1. No acute fracture or ligamentous injury of the cervical spine. 2. Moderate spinal canal stenosis at C6-7 and mild spinal canal stenosis at C3-4 and C5-6. 3. Multifocal hyperintense T2-weighted signal within the spinal cord, most notably at C3-4 and C6-7, likely myelomalacia. 4. Severe left C4, right C5, bilateral C6 and bilateral C7 neural foraminal stenosis.  CTA of head/neck from 04/01/24 IMPRESSION: 1. No emergent large vessel occlusion or high-grade stenosis of the intracranial arteries. 2. 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    COGNITION: Overall cognitive status: {cognition:24006}   SENSATION: {sensation:27233}  COORDINATION: ***  EDEMA:  {edema:24020}  MUSCLE TONE: {LE tone:25568}  MUSCLE LENGTH: Hamstrings: Right *** deg; Left *** deg Debby test: Right *** deg; Left *** deg  DTRs:  {DTR SITE:24025}  POSTURE: {posture:25561}  LOWER EXTREMITY ROM:     {AROM/PROM:27142}  Right Eval Left Eval   Hip flexion    Hip extension    Hip abduction    Hip adduction    Hip internal rotation    Hip external rotation    Knee flexion    Knee extension    Ankle dorsiflexion    Ankle plantarflexion    Ankle inversion    Ankle eversion     (Blank rows = not tested)  LOWER EXTREMITY MMT:    MMT Right Eval Left Eval  Hip flexion    Hip extension    Hip abduction    Hip adduction    Hip internal rotation    Hip external rotation    Knee flexion    Knee extension    Ankle dorsiflexion    Ankle plantarflexion    Ankle inversion    Ankle eversion    (Blank rows = not tested)  BED MOBILITY:  {bed mobility:32615:p}  TRANSFERS: {transfers eval:32620}  RAMP:  {ramp eval:32616}  CURB:  {curb eval:32617}  STAIRS: {stairs eval:32618} GAIT: Findings: {GaitneuroPT:32644::Distance walked: ***,Comments: ***}  FUNCTIONAL TESTS:  {Functional tests:24029}  PATIENT SURVEYS:  {rehab surveys:24030}  TREATMENT DATE: ***    PATIENT EDUCATION: Education details: *** Person educated: {Person educated:25204} Education method: {Education Method:25205} Education comprehension: {Education Comprehension:25206}  HOME EXERCISE PROGRAM: ***  GOALS: Goals reviewed with patient? {yes/no:20286}  SHORT TERM GOALS: Target date: ***  *** Baseline: Goal status: INITIAL  2.  *** Baseline:  Goal status: INITIAL  3.  *** Baseline:  Goal status: INITIAL  4.  *** Baseline:  Goal status: INITIAL  5.  *** Baseline:  Goal status: INITIAL  6.  *** Baseline:  Goal status: INITIAL  LONG TERM GOALS: Target date: ***  *** Baseline:  Goal status: INITIAL  2.  *** Baseline:  Goal status: INITIAL  3.  *** Baseline:  Goal status: INITIAL  4.  *** Baseline:  Goal status: INITIAL  5.  *** Baseline:  Goal status: INITIAL  6.   *** Baseline:  Goal status: INITIAL  ASSESSMENT:  CLINICAL IMPRESSION: Patient is a 63 year old male referred to Neuro OPPT for spondylosis without myelopathy of cervical spine.  Pt's PMH is significant for: Orthostatic syncope, fall, Severe neural foraminal stenosis with concerns for myelomalacia, HTN, BPH. The following deficits were present during the exam: ***. Based on ***, pt is an incr risk for falls. Pt would benefit from skilled PT to address these impairments and functional limitations to maximize functional mobility independence   OBJECTIVE IMPAIRMENTS: {opptimpairments:25111}.   ACTIVITY LIMITATIONS: {activitylimitations:27494}  PARTICIPATION LIMITATIONS: {participationrestrictions:25113}  PERSONAL FACTORS: {Personal factors:25162} are also affecting patient's functional outcome.   REHAB POTENTIAL: {rehabpotential:25112}  CLINICAL DECISION MAKING: {clinical decision making:25114}  EVALUATION COMPLEXITY: {Evaluation complexity:25115}  PLAN:  PT FREQUENCY: {rehab frequency:25116}  PT DURATION: {rehab duration:25117}  PLANNED INTERVENTIONS: {rehab planned interventions:25118::97110-Therapeutic exercises,97530- Therapeutic [phone]- Neuromuscular re-education,97535- Self Rjmz,02859- Manual therapy}  PLAN FOR NEXT SESSION: ***   Tequlia Gonsalves E Kendelle Schweers, PT, DPT 05/24/2024, 7:46 AM

## 2024-05-25 ENCOUNTER — Other Ambulatory Visit: Payer: Self-pay | Admitting: Student

## 2024-05-27 ENCOUNTER — Other Ambulatory Visit: Payer: Self-pay | Admitting: Student

## 2024-05-27 NOTE — Telephone Encounter (Signed)
 Copied from CRM [id number]. Topic: Clinical - Medication Refill >> May 27, 2024  8:22 AM Cherylann RAMAN wrote: Medication: tamsulosin  (FLOMAX ) 0.4 MG CAPS capsule [Pharmacy Med Name: Tamsulosin  HCl 0.4 MG Oral Capsule]   Has the patient contacted their pharmacy? Yes (Agent: If no, request that the patient contact the pharmacy for the refill. If patient does not wish to contact the pharmacy document the reason why and proceed with request.) (Agent: If yes, when and what did the pharmacy advise?)  This is the patient's preferred pharmacy:  [facility] [address] ([address]), [place] - [address] [place] ([place]) [place] [address] Phone: [phone] Fax: [phone]  Is this the correct pharmacy for this prescription? Yes If no, delete pharmacy and type the correct one.   Has the prescription been filled recently? No  Is the patient out of the medication? Yes  Has the patient been seen for an appointment in the last year OR does the patient have an upcoming appointment? Yes  Can we respond through MyChart? Yes  Agent: Please be advised that Rx refills may take up to 3 business days. We ask that you follow-up with your pharmacy.

## 2024-06-12 ENCOUNTER — Ambulatory Visit: Admitting: Physical Therapy

## 2024-06-12 ENCOUNTER — Encounter: Payer: Self-pay | Admitting: Physical Therapy

## 2024-06-12 DIAGNOSIS — M542 Cervicalgia: Secondary | ICD-10-CM

## 2024-06-12 NOTE — Therapy (Signed)
 [facility] [address] [place], [place], [address] Phone: [phone]   Fax:  [phone]  Patient Details  Name: Kyle Zamora MRN: [id number] Date of Birth: 1961-06-30 Referring Provider:  Lanis Pupa, MD  Encounter Date: 06/12/2024  Pt presented for PT evaluation without AD. States he is doing fine, every thing is fine. Pt denies pain or difficulty performing ADLs. States he has been exercising regularly at the gym in his apartment and is feeling good with this. Occasionally has pain in BUEs but if he works on his neck stretches, it reduces. Pt reports he would not like PT at this time but is appreciative of therapist's time. Pt educated pt on how to obtain new referral to return if pain returns, pt verbalized understanding.   Ferrell Flam E Jenel Gierke, PT, DPT 06/12/2024, 10:29 AM  [facility] [address] [place], [place], [address] Phone: [phone]   Fax:  [phone]

## 2024-06-24 ENCOUNTER — Other Ambulatory Visit: Payer: Self-pay | Admitting: Student

## 2024-06-24 MED ORDER — TAMSULOSIN HCL 0.4 MG PO CAPS: 0.4000 mg | ORAL_CAPSULE | Freq: Every day | ORAL | 0 refills | Status: DC

## 2024-06-24 MED ORDER — AMLODIPINE BESYLATE 5 MG PO TABS: 5.0000 mg | ORAL_TABLET | Freq: Every day | ORAL | 0 refills | Status: DC

## 2024-06-24 NOTE — Telephone Encounter (Unsigned)
 Copied from CRM [id number]. Topic: Clinical - Medication Refill >> Jun 24, 2024  8:08 AM Antonio H wrote: Medication: tamsulosin  (FLOMAX ) 0.4 MG CAPS capsule amLODipine  (NORVASC ) 5 MG tablet  Has the patient contacted their pharmacy? Yes (Agent: If no, request that the patient contact the pharmacy for the refill. If patient does not wish to contact the pharmacy document the reason why and proceed with request.) (Agent: If yes, when and what did the pharmacy advise?)  This is the patient's preferred pharmacy:  [facility] [address] ([address] [place] - [address] [place] ([place]) [place] [address] Phone: [phone] Fax: [phone]   Is this the correct pharmacy for this prescription? Yes If no, delete pharmacy and type the correct one.   Has the prescription been filled recently? No  Is the patient out of the medication? No  Has the patient been seen for an appointment in the last year OR does the patient have an upcoming appointment? Yes  Can we respond through MyChart? No  Agent: Please be advised that Rx refills may take up to 3 business days. We ask that you follow-up with your pharmacy.

## 2024-07-24 ENCOUNTER — Ambulatory Visit: Payer: Self-pay | Admitting: Student

## 2024-08-07 ENCOUNTER — Other Ambulatory Visit: Payer: Self-pay | Admitting: Student

## 2024-08-08 NOTE — Telephone Encounter (Signed)
 Medication sent to pharmacy

## 2024-11-20 ENCOUNTER — Other Ambulatory Visit: Payer: Self-pay | Admitting: Student

## 2024-11-21 NOTE — Telephone Encounter (Signed)
 Medication sent to pharmacy

## 2024-11-22 ENCOUNTER — Ambulatory Visit: Payer: Self-pay | Admitting: Student

## 2024-11-22 ENCOUNTER — Encounter: Payer: Self-pay | Admitting: Student

## 2024-11-22 VITALS — BP 128/72 | HR 73 | Ht 71.0 in | Wt 175.8 lb

## 2024-11-22 DIAGNOSIS — E785 Hyperlipidemia, unspecified: Secondary | ICD-10-CM

## 2024-11-22 DIAGNOSIS — F5221 Male erectile disorder: Secondary | ICD-10-CM

## 2024-11-22 DIAGNOSIS — M5412 Radiculopathy, cervical region: Secondary | ICD-10-CM | POA: Diagnosis not present

## 2024-11-22 DIAGNOSIS — F17210 Nicotine dependence, cigarettes, uncomplicated: Secondary | ICD-10-CM | POA: Diagnosis not present

## 2024-11-22 DIAGNOSIS — F1090 Alcohol use, unspecified, uncomplicated: Secondary | ICD-10-CM

## 2024-11-22 DIAGNOSIS — I10 Essential (primary) hypertension: Secondary | ICD-10-CM | POA: Diagnosis present

## 2024-11-22 DIAGNOSIS — Z872 Personal history of diseases of the skin and subcutaneous tissue: Secondary | ICD-10-CM | POA: Diagnosis not present

## 2024-11-22 DIAGNOSIS — Z0000 Encounter for general adult medical examination without abnormal findings: Secondary | ICD-10-CM

## 2024-11-22 DIAGNOSIS — Z23 Encounter for immunization: Secondary | ICD-10-CM | POA: Diagnosis not present

## 2024-11-22 DIAGNOSIS — G959 Disease of spinal cord, unspecified: Secondary | ICD-10-CM

## 2024-11-22 DIAGNOSIS — Z720 Tobacco use: Secondary | ICD-10-CM

## 2024-11-22 MED ORDER — TADALAFIL 10 MG PO TABS: 10.0000 mg | ORAL_TABLET | Freq: Every day | ORAL | 0 refills | Status: AC | PRN

## 2024-11-22 MED ORDER — GABAPENTIN 100 MG PO CAPS: ORAL_CAPSULE | ORAL | 11 refills | Status: AC

## 2024-11-22 MED ORDER — AMLODIPINE BESYLATE 10 MG PO TABS: 10.0000 mg | ORAL_TABLET | Freq: Every day | ORAL | 3 refills | Status: AC

## 2024-11-22 NOTE — Progress Notes (Unsigned)
 "  Established Patient Office Visit  Subjective   Patient ID: Kyle Zamora, male    DOB: 10-Feb-1961  Age: 64 y.o. MRN: [id number]  Chief Complaint  Patient presents with   Medication Refill    [facility]) flomax  (requesting prn refills) and gabapentin     Erectile Dysfunction    Requesting new rx for Viagra     Kyle Zamora is a 64 y.o. who presents to the clinic for ED, HTN, alcohol & tobacco use, along with concerns for a cyst. Please see problem based assessment and plan for additional details.   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) 04/05/2024   Diverticulosis of colon without hemorrhage 12/02/2023   Adenomatous polyp of transverse colon 12/02/2023   Adenomatous polyp of sigmoid colon 12/02/2023   Grade I internal hemorrhoids 12/02/2023   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   Duodenitis 12/02/2023   Hx of abscess of skin and subcutaneous tissue 10/17/2023   HLD (hyperlipidemia) 10/17/2023   Arm pain 07/04/2023   Knee pain 04/10/2023   Hypertension 03/30/2023   BPH (benign prostatic hyperplasia) 03/30/2023   Alcohol use 03/30/2023   Colon cancer screening 03/30/2023   ED (erectile dysfunction) of non-organic origin 03/30/2023   Tobacco use 03/30/2023   Health care maintenance 03/30/2023      Objective:     BP 128/72 (BP Location: Right Arm, Patient Position: Sitting, Cuff Size: Normal)   Pulse 73   Ht 5' 11 (1.803 m)   Wt 175 lb 12.8 oz (79.7 kg)   SpO2 94%   BMI 24.52 kg/m  BP Readings from Last 3 Encounters:  11/22/24 128/72  04/15/24 99/72  04/04/24 137/81   Wt Readings from Last 3 Encounters:  11/22/24 175 lb 12.8 oz (79.7 kg)  04/15/24 160 lb (72.6 kg)  04/01/24 160 lb 0.9 oz (72.6 kg)      Physical Exam Vitals reviewed.  Constitutional:       General: He is not in acute distress.    Appearance: He is not ill-appearing, toxic-appearing or diaphoretic.  Cardiovascular:     Rate and Rhythm: Normal rate and regular rhythm.  Pulmonary:     Effort: Pulmonary effort is normal.     Breath sounds: Normal breath sounds.  Musculoskeletal:     Right lower leg: No edema.     Left lower leg: No edema.  Skin:    General: Skin is warm and dry.     Comments: Small cyst present on his L midaxillary line.   Neurological:     Mental Status: He is alert.  Psychiatric:        Mood and Affect: Mood and affect normal.      Results for orders placed or performed in visit on 11/22/24  Basic metabolic panel with GFR  Result Value Ref Range   Glucose 81 70 - 99 mg/dL   BUN 9 8 - 27 mg/dL   Creatinine, Ser 9.34 (L) 0.76 - 1.27 mg/dL   eGFR 893 >40 fO/fpw/8.26   BUN/Creatinine Ratio 14 10 - 24   Sodium 144 134 - 144 mmol/L   Potassium 4.8 3.5 - 5.2 mmol/L   Chloride 104 96 - 106 mmol/L   CO2 27 20 - 29 mmol/L   Calcium  9.6 8.6 - 10.2 mg/dL  Lipid Profile  Result  Value Ref Range   Cholesterol, Total 163 100 - 199 mg/dL   Triglycerides 91 0 - 149 mg/dL   HDL 46 >60 mg/dL   VLDL Cholesterol Cal 17 5 - 40 mg/dL   LDL Chol Calc (NIH) 899 (H) 0 - 99 mg/dL   Chol/HDL Ratio 3.5 0.0 - 5.0 ratio    Last metabolic panel Lab Results  Component Value Date   GLUCOSE 81 11/22/2024   NA 144 11/22/2024   K 4.8 11/22/2024   CL 104 11/22/2024   CO2 27 11/22/2024   BUN 9 11/22/2024   CREATININE 0.65 (L) 11/22/2024   GFRNONAA >60 04/03/2024   CALCIUM  9.6 11/22/2024   PROT 7.7 04/01/2024   ALBUMIN 4.0 04/01/2024   LABGLOB 2.3 03/29/2023   AGRATIO 1.7 03/29/2023   BILITOT 0.7 04/01/2024   ALKPHOS 78 04/01/2024   AST 19 04/01/2024   ALT 15 04/01/2024   ANIONGAP 4 (L) 04/03/2024   Last hemoglobin A1c Lab Results  Component Value Date   HGBA1C 5.7 (H) 03/29/2023      The 10-year ASCVD risk score (Arnett DK, et al., 2019) is: 23.8%     Assessment & Plan:   Problem List Items Addressed This Visit       Cardiovascular and Mediastinum   Hypertension - Primary   Patient presents with a history of hypertension with a blood pressure today of 128/72. Their hypertension is controlled on a regimen of amlodipine  10 mg (has been taking mom's Rx).  Prior BMP in May 2025, Scr was 0.66.   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 He drinks anywhere from 0-6/7 drinks per day depending on the availability of alcohol. His last drink was 2 weeks prior. He does not drink and drive, he reports using uber or public transportation if he is drinking. Plan: -Patient declined naltrexone -Patient declined AA      ED (erectile dysfunction) of non-organic origin   Patient reports a long history of ED for at least 5-6 years now. He reports having erections in the morning but describes having a difficult time achieving an erection during sexual encounters with partner. He denies any stressors that he is bale to acknowledge. Although he has never been prescribed Viagra, he took someone's Viagra years ago and he was able to achieve and maintain an erection.  Plan: -Start Tadalafil  10 mg daily prn -If tadalafil  works, can send in refills for 5mg  daily prn  -Patient instructed on use and to avoid nitroglycerin during use        Tobacco use   Patient reports that his last cigarette was 2 weeks prior. He only smokes when he drinks alcohol.  Plan: -Encourage cessation -Patient declined medications to  help with tobacco cessation      Hx of abscess of skin and subcutaneous tissue   Patient has a history of what appears to be an epidermal cyst along the left midaxillary line. He describes a cycle of drainage from the  cyst with malodorous fluid followed by inflammation/ accumulation of fluid.  Plan: -Dermatology referral       Relevant Orders   Ambulatory referral to Dermatology   HLD (hyperlipidemia)   Not on a statin at this time. Will check lipid profile.       Relevant Medications   amLODipine  (NORVASC ) 10 MG tablet   tadalafil  (CIALIS ) 10 MG tablet   Other Relevant Orders   Lipid Profile (Completed)   Health care maintenance   Influenza and Prevnar vaccine given today      Other Visit Diagnoses       Encounter for immunization       Relevant Orders   Flu vaccine trivalent PF, 6mos and older(Flulaval,Afluria,Fluarix,Fluzone) (Completed)   Pneumococcal conjugate vaccine 20-valent (Completed)       Return in about 6 months (around 05/22/2025) for Chronic conditions .    Damien Lease, DO  "

## 2024-11-22 NOTE — Patient Instructions (Signed)
 Thank you, [name] for allowing us  to provide your care today. Today we discussed blood pressure, alcohol and tobacco use, erectile dysfunction, high cholesterol, and the abscess under the arm.    For the blood pressure: take amlodipine  10 mg daily For the alcohol and tobacco use: do NOT drink and drive, continue to avoid use of these products For the abscess under the arm, I placed a referral to dermatology For the erectile dysfunction:  -Take on tablet of tadalafil  10 mg once a day as needed. Take this within 30 minutes of sexual intercourse.  -Do NOT take any nitroglycerin medications while using Tadalafil . -Please note that you may experience headache, nausea, or muscle aches after using this medication -If this medication helps you, please send us  a mychart message and we can order refills   I have ordered the following labs for you:   Lab Orders         Basic metabolic panel with GFR         Lipid Profile       Referrals ordered today:    Referral Orders         Ambulatory referral to Dermatology      I have ordered the following medication/changed the following medications:   Stop the following medications: Medications Discontinued During This Encounter  Medication Reason   amLODipine  (NORVASC ) 5 MG tablet    acetaminophen  (TYLENOL ) 500 MG tablet    gabapentin  (NEURONTIN ) 100 MG capsule Reorder   Omega-3 Fatty Acids (FISH OIL OMEGA-3 PO)      Start the following medications: Meds ordered this encounter  Medications   amLODipine  (NORVASC ) 10 MG tablet    Sig: Take 1 tablet (10 mg total) by mouth daily.    Dispense:  90 tablet    Refill:  3   gabapentin  (NEURONTIN ) 100 MG capsule    Sig: Take 1 capsule (100 mg total) by mouth 2 (two) times daily AND 3 capsules (300 mg total) at bedtime.    Dispense:  150 capsule    Refill:  11   tadalafil  (CIALIS ) 10 MG tablet    Sig: Take 1 tablet (10 mg total) by mouth daily as needed for erectile dysfunction. DO NOT  TAKE MORE THAN ONE TABLET PER DAY    Dispense:  10 tablet    Refill:  0     Follow up: 6 months    Remember:   Should you have any questions or concerns please call the internal medicine clinic at [phone].     Please note that our late policy has changed.  If you are more than 15 minutes late to your appointment, you may be asked to reschedule your appointment.  Dr. Kandis, D.O. [facility]

## 2024-11-22 NOTE — Assessment & Plan Note (Signed)
 Influenza and Prevnar vaccine given today

## 2024-11-23 LAB — BASIC METABOLIC PANEL WITH GFR
BUN/Creatinine Ratio: 14 (ref 10–24)
BUN: 9 mg/dL (ref 8–27)
CO2: 27 mmol/L (ref 20–29)
Calcium: 9.6 mg/dL (ref 8.6–10.2)
Chloride: 104 mmol/L (ref 96–106)
Creatinine, Ser: 0.65 mg/dL — ABNORMAL LOW (ref 0.76–1.27)
Glucose: 81 mg/dL (ref 70–99)
Potassium: 4.8 mmol/L (ref 3.5–5.2)
Sodium: 144 mmol/L (ref 134–144)
eGFR: 106 mL/min/1.73

## 2024-11-23 LAB — LIPID PANEL
Chol/HDL Ratio: 3.5 ratio (ref 0.0–5.0)
Cholesterol, Total: 163 mg/dL (ref 100–199)
HDL: 46 mg/dL
LDL Chol Calc (NIH): 100 mg/dL — ABNORMAL HIGH (ref 0–99)
Triglycerides: 91 mg/dL (ref 0–149)
VLDL Cholesterol Cal: 17 mg/dL (ref 5–40)

## 2024-11-24 NOTE — Assessment & Plan Note (Signed)
 Not on a statin at this time. Will check lipid profile.

## 2024-11-24 NOTE — Assessment & Plan Note (Signed)
 Patient has a history of what appears to be an epidermal cyst along the left midaxillary line. He describes a cycle of drainage from the cyst with malodorous fluid followed by inflammation/ accumulation of fluid.  Plan: -Dermatology referral

## 2024-11-24 NOTE — Assessment & Plan Note (Signed)
 Patient reports that his last cigarette was 2 weeks prior. He only smokes when he drinks alcohol.  Plan: -Encourage cessation -Patient declined medications to help with tobacco cessation

## 2024-11-24 NOTE — Assessment & Plan Note (Signed)
 Patient reports a long history of ED for at least 5-6 years now. He reports having erections in the morning but describes having a difficult time achieving an erection during sexual encounters with partner. He denies any stressors that he is bale to acknowledge. Although he has never been prescribed Viagra, he took someone's Viagra years ago and he was able to achieve and maintain an erection.  Plan: -Start Tadalafil  10 mg daily prn -If tadalafil  works, can send in refills for 5mg  daily prn  -Patient instructed on use and to avoid nitroglycerin during use

## 2024-11-24 NOTE — Assessment & Plan Note (Signed)
 Patient presents with a history of hypertension with a blood pressure today of 128/72. Their hypertension is controlled on a regimen of amlodipine  10 mg (has been taking mom's Rx).  Prior BMP in May 2025, Scr was 0.66.   Plan: -Continue current regimen of: amlodipine  10 mg, increased from prior Rx of amlodipine  5mg   -BMP today

## 2024-11-24 NOTE — Assessment & Plan Note (Signed)
 Patient is a binge drinker. He drinks anywhere from 0-6/7 drinks per day depending on the availability of alcohol. His last drink was 2 weeks prior. He does not drink and drive, he reports using uber or public transportation if he is drinking. Plan: -Patient declined naltrexone -Patient declined AA

## 2024-11-25 NOTE — Progress Notes (Signed)
 [facility] Attending  Case discussed with the resident at the time of the visit.  We reviewed the residents history and exam and pertinent patient test results.  I agree with the assessment, diagnosis, and plan of care documented in the residents note.    The 10-year ASCVD risk score (Arnett DK, et al., 2019) is: 23.8%   Values used to calculate the score:     Age: 64 years     Clinically relevant sex: Male     Is Non-Hispanic African American: Yes     Diabetic: No     Tobacco smoker: Yes     Systolic Blood Pressure: 128 mmHg     Is BP treated: Yes     HDL Cholesterol: 46 mg/dL     Total Cholesterol: 163 mg/dL    Agree with Dr. Oscar plan to start statin

## 2024-11-26 ENCOUNTER — Ambulatory Visit: Payer: Self-pay | Admitting: Student

## 2024-11-26 MED ORDER — ROSUVASTATIN CALCIUM 20 MG PO TABS: 20.0000 mg | ORAL_TABLET | Freq: Every day | ORAL | 11 refills | Status: AC

## 2024-12-05 NOTE — Telephone Encounter (Unsigned)
 Copied from CRM #[id number]. Topic: Medical Record Request - Records Request >> Dec 02, 2024  1:37 PM Mercer PEDLAR wrote: Reason for CRM:  Patient is requesting a copy of his most recent lab results as well as advice on his diet for cholesterol. He is requesting for it to be mailed to him if possible. Please callbcak to let him know if that's possible or if he needs to pick it up.

## 2025-08-13 ENCOUNTER — Ambulatory Visit: Admitting: Physician Assistant
